# Patient Record
Sex: Female | Born: 1995 | Race: Black or African American | Hispanic: No | Marital: Single | Smoking: Former smoker
Health system: Southern US, Community
[De-identification: ages and names within clinical notes are randomized; demographics above are authoritative.]

---
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
## 2015-09-18 ENCOUNTER — Inpatient Hospital Stay (HOSPITAL_COMMUNITY): Payer: Medicaid Other

## 2015-09-18 ENCOUNTER — Inpatient Hospital Stay (HOSPITAL_COMMUNITY)
Admission: AD | Admit: 2015-09-18 | Discharge: 2015-09-18 | Disposition: A | Discharge status: Home / Self Care | Payer: Medicaid Other | Source: Ambulatory Visit | Attending: Family Medicine | Admitting: Family Medicine

## 2015-09-18 ENCOUNTER — Encounter (HOSPITAL_COMMUNITY): Payer: Self-pay

## 2015-09-18 DIAGNOSIS — O26899 Other specified pregnancy related conditions, unspecified trimester: Secondary | ICD-10-CM

## 2015-09-18 DIAGNOSIS — N912 Amenorrhea, unspecified: Secondary | ICD-10-CM | POA: Diagnosis not present

## 2015-09-18 DIAGNOSIS — R109 Unspecified abdominal pain: Secondary | ICD-10-CM | POA: Diagnosis present

## 2015-09-18 DIAGNOSIS — F17200 Nicotine dependence, unspecified, uncomplicated: Secondary | ICD-10-CM | POA: Insufficient documentation

## 2015-09-18 LAB — CBC WITH DIFFERENTIAL/PLATELET
Basophils Absolute: 0 10*3/uL (ref 0.0–0.1)
Basophils Relative: 0 %
EOS ABS: 0.2 10*3/uL (ref 0.0–0.7)
EOS PCT: 1 %
HCT: 44.9 % (ref 36.0–46.0)
Hemoglobin: 14.6 g/dL (ref 12.0–15.0)
LYMPHS ABS: 2.3 10*3/uL (ref 0.7–4.0)
Lymphocytes Relative: 17 %
MCH: 25.3 pg — AB (ref 26.0–34.0)
MCHC: 32.5 g/dL (ref 30.0–36.0)
MCV: 78 fL (ref 78.0–100.0)
MONOS PCT: 4 %
Monocytes Absolute: 0.5 10*3/uL (ref 0.1–1.0)
Neutro Abs: 10.3 10*3/uL — ABNORMAL HIGH (ref 1.7–7.7)
Neutrophils Relative %: 78 %
PLATELETS: 342 10*3/uL (ref 150–400)
RBC: 5.76 MIL/uL — ABNORMAL HIGH (ref 3.87–5.11)
RDW: 14.2 % (ref 11.5–15.5)
WBC: 13.3 10*3/uL — ABNORMAL HIGH (ref 4.0–10.5)

## 2015-09-18 LAB — URINALYSIS, ROUTINE W REFLEX MICROSCOPIC
Bilirubin Urine: NEGATIVE
Glucose, UA: NEGATIVE mg/dL
Hgb urine dipstick: NEGATIVE
Ketones, ur: NEGATIVE mg/dL
Leukocytes, UA: NEGATIVE
Nitrite: NEGATIVE
Protein, ur: NEGATIVE mg/dL
Specific Gravity, Urine: 1.02 (ref 1.005–1.030)
pH: 6.5 (ref 5.0–8.0)

## 2015-09-18 LAB — HCG, QUANTITATIVE, PREGNANCY: hCG, Beta Chain, Quant, S: <1 m[IU]/mL (ref <5)

## 2015-09-18 LAB — POCT PREGNANCY, URINE: Preg Test, Ur: NEGATIVE

## 2015-09-18 NOTE — MAU Note (Signed)
Pt had positive HPT. Stated she has been having abd pain and cramping off and on x 2 weeks.

## 2015-09-18 NOTE — MAU Provider Note (Signed)
History     CSN: [id number]  Arrival date and time: 09/18/15 [id number]   First Provider Initiated Contact with Patient 09/18/15 2206      Chief Complaint  Patient presents with  . Abdominal Pain   HPI Comments: Sharon Gould is a 20 y.o. Who presents today for a pregnancy test. She states that she had a +UPT at home. She reports LMP of approx 08/04/15. She denies any vaginal bleeding, but has had some intermittent abdominal pain.   Abdominal Pain This is a new problem. The current episode started 1 to 4 weeks ago. The problem occurs intermittently. The problem has been unchanged. The pain is located in the suprapubic region. The pain is at a severity of 0/10 (currently. Pain is only present occasionally in the mornings). Pertinent negatives include no constipation, diarrhea, dysuria, fever, nausea or vomiting. Nothing aggravates the pain. The pain is relieved by nothing. She has tried nothing for the symptoms.     History reviewed. No pertinent past medical history.  History reviewed. No pertinent past surgical history.  History reviewed. No pertinent family history.  Social History  Substance Use Topics  . Smoking status: Current Every Day Smoker  . Smokeless tobacco: None  . Alcohol Use: Yes    Allergies: No Known Allergies  Prescriptions prior to admission  Medication Sig Dispense Refill Last Dose  . ibuprofen (ADVIL,MOTRIN) 200 MG tablet Take 400 mg by mouth every 6 (six) hours as needed for moderate pain.   10/28/2013 at Unknown time  . Ibuprofen 200 MG CAPS Take 4 capsules (800 mg total) by mouth 3 (three) times daily. Take 600mg  3-4 times a day x 1 week. Then use as needed every 6 hours. 120 each 0     Review of Systems  Constitutional: Negative for fever and chills.  Gastrointestinal: Positive for abdominal pain. Negative for nausea, vomiting, diarrhea and constipation.  Genitourinary: Negative for dysuria and urgency.   Physical Exam   Blood pressure 141/82, pulse  73, temperature 99 F (37.2 C), temperature source Oral, resp. rate 18, height 5\' 7"  (1.702 m), weight 109.861 kg (242 lb 3.2 oz), last menstrual period 08/04/2015, unknown if currently breastfeeding.  Physical Exam  Nursing note and vitals reviewed. Constitutional: She is oriented to person, place, and time. She appears well-developed and well-nourished. No distress.  HENT:  Head: Normocephalic.  Cardiovascular: Normal rate.   Respiratory: Effort normal.  GI: Soft. There is no tenderness. There is no rebound.  Neurological: She is alert and oriented to person, place, and time.  Skin: Skin is warm and dry.  Psychiatric: She has a normal mood and affect.     Results for orders placed or performed during the hospital encounter of 09/18/15 (from the past 24 hour(s))  Urinalysis, Routine w reflex microscopic ([facility])     Status: None   Collection Time: 09/18/15  7:00 PM  Result Value Ref Range   Color, Urine YELLOW YELLOW   APPearance CLEAR CLEAR   Specific Gravity, Urine 1.020 1.005 - 1.030   pH 6.5 5.0 - 8.0   Glucose, UA NEGATIVE NEGATIVE mg/dL   Hgb urine dipstick NEGATIVE NEGATIVE   Bilirubin Urine NEGATIVE NEGATIVE   Ketones, ur NEGATIVE NEGATIVE mg/dL   Protein, ur NEGATIVE NEGATIVE mg/dL   Nitrite NEGATIVE NEGATIVE   Leukocytes, UA NEGATIVE NEGATIVE  Pregnancy, urine POC     Status: None   Collection Time: 09/18/15  7:11 PM  Result Value Ref Range   Preg  Test, Ur NEGATIVE NEGATIVE  CBC with Differential/Platelet     Status: Abnormal   Collection Time: 09/18/15  8:33 PM  Result Value Ref Range   WBC 13.3 (H) 4.0 - 10.5 K/uL   RBC 5.76 (H) 3.87 - 5.11 MIL/uL   Hemoglobin 14.6 12.0 - 15.0 g/dL   HCT 16.1 09.6 - 04.5 %   MCV 78.0 78.0 - 100.0 fL   MCH 25.3 (L) 26.0 - 34.0 pg   MCHC 32.5 30.0 - 36.0 g/dL   RDW 40.9 81.1 - 91.4 %   Platelets 342 150 - 400 K/uL   Neutrophils Relative % 78 %   Neutro Abs 10.3 (H) 1.7 - 7.7 K/uL   Lymphocytes Relative 17 %   Lymphs  Abs 2.3 0.7 - 4.0 K/uL   Monocytes Relative 4 %   Monocytes Absolute 0.5 0.1 - 1.0 K/uL   Eosinophils Relative 1 %   Eosinophils Absolute 0.2 0.0 - 0.7 K/uL   Basophils Relative 0 %   Basophils Absolute 0.0 0.0 - 0.1 K/uL  ABO/Rh     Status: None (Preliminary result)   Collection Time: 09/18/15  8:33 PM  Result Value Ref Range   ABO/RH(D) B POS   hCG, quantitative, pregnancy     Status: None   Collection Time: 09/18/15  8:33 PM  Result Value Ref Range   hCG, Beta Chain, Quant, S <1 <5 mIU/mL    MAU Course  Procedures  MDM   Assessment and Plan   1. Amenorrhea    DC home Comfort measures reviewed  Contraception options reviewed  RX: none  Return to MAU as needed   Follow-up Information    Schedule an appointment as soon as possible for a visit with [facility].   Contact information:   [address] [place] [address] [phone]         Tawnya Crook 09/18/2015, 10:09 PM

## 2015-09-18 NOTE — Discharge Instructions (Signed)
Contraception Choices Contraception (birth control) is the use of any methods or devices to prevent pregnancy. Below are some methods to help avoid pregnancy. HORMONAL METHODS   Contraceptive implant. This is a thin, plastic tube containing progesterone hormone. It does not contain estrogen hormone. Your health care provider inserts the tube in the inner part of the upper arm. The tube can remain in place for up to 3 years. After 3 years, the implant must be removed. The implant prevents the ovaries from releasing an egg (ovulation), thickens the cervical mucus to prevent sperm from entering the uterus, and thins the lining of the inside of the uterus.  Progesterone-only injections. These injections are given every 3 months by your health care provider to prevent pregnancy. This synthetic progesterone hormone stops the ovaries from releasing eggs. It also thickens cervical mucus and changes the uterine lining. This makes it harder for sperm to survive in the uterus.  Birth control pills. These pills contain estrogen and progesterone hormone. They work by preventing the ovaries from releasing eggs (ovulation). They also cause the cervical mucus to thicken, preventing the sperm from entering the uterus. Birth control pills are prescribed by a health care provider.Birth control pills can also be used to treat heavy periods.  Minipill. This type of birth control pill contains only the progesterone hormone. They are taken every day of each month and must be prescribed by your health care provider.  Birth control patch. The patch contains hormones similar to those in birth control pills. It must be changed once a week and is prescribed by a health care provider.  Vaginal ring. The ring contains hormones similar to those in birth control pills. It is left in the vagina for 3 weeks, removed for 1 week, and then a new one is put back in place. The patient must be comfortable inserting and removing the ring  from the vagina.A health care provider's prescription is necessary.  Emergency contraception. Emergency contraceptives prevent pregnancy after unprotected sexual intercourse. This pill can be taken right after sex or up to 5 days after unprotected sex. It is most effective the sooner you take the pills after having sexual intercourse. Most emergency contraceptive pills are available without a prescription. Check with your pharmacist. Do not use emergency contraception as your only form of birth control. BARRIER METHODS   Female condom. This is a thin sheath (latex or rubber) that is worn over the penis during sexual intercourse. It can be used with spermicide to increase effectiveness.  Female condom. This is a soft, loose-fitting sheath that is put into the vagina before sexual intercourse.  Diaphragm. This is a soft, latex, dome-shaped barrier that must be fitted by a health care provider. It is inserted into the vagina, along with a spermicidal jelly. It is inserted before intercourse. The diaphragm should be left in the vagina for 6 to 8 hours after intercourse.  Cervical cap. This is a round, soft, latex or plastic cup that fits over the cervix and must be fitted by a health care provider. The cap can be left in place for up to 48 hours after intercourse.  Sponge. This is a soft, circular piece of polyurethane foam. The sponge has spermicide in it. It is inserted into the vagina after wetting it and before sexual intercourse.  Spermicides. These are chemicals that kill or block sperm from entering the cervix and uterus. They come in the form of creams, jellies, suppositories, foam, or tablets. They do not require a   prescription. They are inserted into the vagina with an applicator before having sexual intercourse. The process must be repeated every time you have sexual intercourse. INTRAUTERINE CONTRACEPTION  Intrauterine device (IUD). This is a T-shaped device that is put in a woman's uterus  during a menstrual period to prevent pregnancy. There are 2 types:  Copper IUD. This type of IUD is wrapped in copper wire and is placed inside the uterus. Copper makes the uterus and fallopian tubes produce a fluid that kills sperm. It can stay in place for 10 years.  Hormone IUD. This type of IUD contains the hormone progestin (synthetic progesterone). The hormone thickens the cervical mucus and prevents sperm from entering the uterus, and it also thins the uterine lining to prevent implantation of a fertilized egg. The hormone can weaken or kill the sperm that get into the uterus. It can stay in place for 3-5 years, depending on which type of IUD is used. PERMANENT METHODS OF CONTRACEPTION  Female tubal ligation. This is when the woman's fallopian tubes are surgically sealed, tied, or blocked to prevent the egg from traveling to the uterus.  Hysteroscopic sterilization. This involves placing a small coil or insert into each fallopian tube. Your doctor uses a technique called hysteroscopy to do the procedure. The device causes scar tissue to form. This results in permanent blockage of the fallopian tubes, so the sperm cannot fertilize the egg. It takes about 3 months after the procedure for the tubes to become blocked. You must use another form of birth control for these 3 months.  Female sterilization. This is when the female has the tubes that carry sperm tied off (vasectomy).This blocks sperm from entering the vagina during sexual intercourse. After the procedure, the man can still ejaculate fluid (semen). NATURAL PLANNING METHODS  Natural family planning. This is not having sexual intercourse or using a barrier method (condom, diaphragm, cervical cap) on days the woman could become pregnant.  Calendar method. This is keeping track of the length of each menstrual cycle and identifying when you are fertile.  Ovulation method. This is avoiding sexual intercourse during ovulation.  Symptothermal  method. This is avoiding sexual intercourse during ovulation, using a thermometer and ovulation symptoms.  Post-ovulation method. This is timing sexual intercourse after you have ovulated. Regardless of which type or method of contraception you choose, it is important that you use condoms to protect against the transmission of sexually transmitted infections (STIs). Talk with your health care provider about which form of contraception is most appropriate for you.   This information is not intended to replace advice given to you by your health care provider. Make sure you discuss any questions you have with your health care provider.   Document Released: 08/30/2005 Document Revised: 09/04/2013 Document Reviewed: 02/22/2013 Elsevier Interactive Patient Education 2016 Elsevier Inc.  

## 2015-09-19 LAB — HIV ANTIBODY (ROUTINE TESTING W REFLEX): HIV Screen 4th Generation wRfx: NONREACTIVE

## 2015-09-19 LAB — RPR: RPR Ser Ql: NONREACTIVE

## 2015-09-19 LAB — ABO/RH: ABO/RH(D): B POS

## 2015-12-30 ENCOUNTER — Ambulatory Visit (HOSPITAL_COMMUNITY)
Admission: EM | Admit: 2015-12-30 | Discharge: 2015-12-30 | Disposition: A | Discharge status: Home / Self Care | Payer: Medicaid Other | Attending: Family Medicine | Admitting: Family Medicine

## 2015-12-30 ENCOUNTER — Encounter (HOSPITAL_COMMUNITY): Payer: Self-pay | Admitting: Emergency Medicine

## 2015-12-30 DIAGNOSIS — F17210 Nicotine dependence, cigarettes, uncomplicated: Secondary | ICD-10-CM | POA: Diagnosis not present

## 2015-12-30 DIAGNOSIS — Z711 Person with feared health complaint in whom no diagnosis is made: Secondary | ICD-10-CM

## 2015-12-30 DIAGNOSIS — Z79899 Other long term (current) drug therapy: Secondary | ICD-10-CM | POA: Insufficient documentation

## 2015-12-30 DIAGNOSIS — Z202 Contact with and (suspected) exposure to infections with a predominantly sexual mode of transmission: Secondary | ICD-10-CM | POA: Diagnosis present

## 2015-12-30 LAB — POCT URINALYSIS DIP (DEVICE)
BILIRUBIN URINE: NEGATIVE
GLUCOSE, UA: NEGATIVE mg/dL
KETONES UR: NEGATIVE mg/dL
Leukocytes, UA: NEGATIVE
NITRITE: NEGATIVE
PROTEIN: NEGATIVE mg/dL
Specific Gravity, Urine: 1.015 (ref 1.005–1.030)
Urobilinogen, UA: 0.2 mg/dL (ref 0.0–1.0)
pH: 7 (ref 5.0–8.0)

## 2015-12-30 NOTE — Discharge Instructions (Signed)
Safe Sex  Safe sex is about reducing the risk of giving or getting a sexually transmitted disease (STD). STDs are spread through sexual contact involving the genitals, mouth, or rectum. Some STDs can be cured and others cannot. Safe sex can also prevent unintended pregnancies.   WHAT ARE SOME SAFE SEX PRACTICES?  · Limit your sexual activity to only one partner who is having sex with only you.  · Talk to your partner about his or her past partners, past STDs, and drug use.  · Use a condom every time you have sexual intercourse. This includes vaginal, oral, and anal sexual activity. Both females and males should wear condoms during oral sex. Only use latex or polyurethane condoms and water-based lubricants. Using petroleum-based lubricants or oils to lubricate a condom will weaken the condom and increase the chance that it will break. The condom should be in place from the beginning to the end of sexual activity. Wearing a condom reduces, but does not completely eliminate, your risk of getting or giving an STD. STDs can be spread by contact with infected body fluids and skin.  · Get vaccinated for hepatitis B and HPV.  · Avoid alcohol and recreational drugs, which can affect your judgment. You may forget to use a condom or participate in high-risk sex.  · For females, avoid douching after sexual intercourse. Douching can spread an infection farther into the reproductive tract.  · Check your body for signs of sores, blisters, rashes, or unusual discharge. See your health care provider if you notice any of these signs.  · Avoid sexual contact if you have symptoms of an infection or are being treated for an STD. If you or your partner has herpes, avoid sexual contact when blisters are present. Use condoms at all other times.  · If you are at risk of being infected with HIV, it is recommended that you take a prescription medicine daily to prevent HIV infection. This is called pre-exposure prophylaxis (PrEP). You are  considered at risk if:    You are a man who has sex with other men (MSM).    You are a heterosexual man or woman who is sexually active with more than one partner.    You take drugs by injection.    You are sexually active with a partner who has HIV.  · Talk with your health care provider about whether you are at high risk of being infected with HIV. If you choose to begin PrEP, you should first be tested for HIV. You should then be tested every 3 months for as long as you are taking PrEP.  · See your health care provider for regular screenings, exams, and tests for other STDs. Before having sex with a new partner, each of you should be screened for STDs and should talk about the results with each other.  WHAT ARE THE BENEFITS OF SAFE SEX?   · There is less chance of getting or giving an STD.  · You can prevent unwanted or unintended pregnancies.  · By discussing safe sex concerns with your partner, you may increase feelings of intimacy, comfort, trust, and honesty between the two of you.     This information is not intended to replace advice given to you by your health care provider. Make sure you discuss any questions you have with your health care provider.     Document Released: 10/07/2004 Document Revised: 09/20/2014 Document Reviewed: 02/21/2012  Elsevier Interactive Patient Education ©2016 Elsevier Inc.

## 2015-12-30 NOTE — ED Notes (Signed)
PT discharged by Barbra SarksFrank Patrick, PA. Pt ambulates out of department with her visitor.

## 2015-12-30 NOTE — ED Provider Notes (Signed)
CSN: [id number]     Arrival date & time 12/30/15  1841 History   First MD Initiated Contact with Patient 12/30/15 2012     Chief Complaint  Patient presents with  . SEXUALLY TRANSMITTED DISEASE    PT wants an STD check   (Consider location/radiation/quality/duration/timing/severity/associated sxs/prior Treatment) HPI STD eval no symptoms. Has not been checked in a while.  History reviewed. No pertinent past medical history. History reviewed. No pertinent past surgical history. No family history on file. Social History  Substance Use Topics  . Smoking status: Current Every Day Smoker -- 0.25 packs/day  . Smokeless tobacco: None  . Alcohol Use: Yes     Comment: occasionally   OB History    Gravida Para Term Preterm AB TAB SAB Ectopic Multiple Living   0              Review of Systems Here for STD check no symptoms Allergies  Review of patient's allergies indicates no known allergies.  Home Medications   Prior to Admission medications   Medication Sig Start Date End Date Taking? Authorizing Provider  ibuprofen (ADVIL,MOTRIN) 200 MG tablet Take 400 mg by mouth every 6 (six) hours as needed for moderate pain.    Historical Provider, MD  Ibuprofen 200 MG CAPS Take 4 capsules (800 mg total) by mouth 3 (three) times daily. Take 600mg  3-4 times a day x 1 week. Then use as needed every 6 hours. 10/29/13   Joelyn OmsJalan Burton, MD   Meds Ordered and Administered this Visit  Medications - No data to display  BP 146/82 mmHg  Pulse 88  Temp(Src) 98.7 F (37.1 C)  Resp 16  SpO2 99%  LMP 12/29/2015 No data found.   Physical Exam NURSES NOTES AND VITAL SIGNS REVIEWED. CONSTITUTIONAL: Well developed, well nourished, no acute distress HEENT: normocephalic, atraumatic EYES: Conjunctiva normal NECK:normal ROM, supple, no adenopathy PULMONARY:No respiratory distress, normal effort MUSCULOSKELETAL: Normal ROM of all extremities,  SKIN: warm and dry without rash PSYCHIATRIC: Mood and  affect, behavior are normal  ED Course  Procedures (including critical care time)  Labs Review Labs Reviewed  POCT URINALYSIS DIP (DEVICE) - Abnormal; Notable for the following:    Hgb urine dipstick LARGE (*)    All other components within normal limits  URINE CYTOLOGY ANCILLARY ONLY    Imaging Review No results found.   Visual Acuity Review  Right Eye Distance:   Left Eye Distance:   Bilateral Distance:    Right Eye Near:   Left Eye Near:    Bilateral Near:         MDM   1. Concern about STD in female without diagnosis     Patient is reassured that there are no issues that require transfer to higher level of care at this time or additional tests. Patient is advised to continue home symptomatic treatment. Patient is advised that if there are new or worsening symptoms to attend the emergency department, contact primary care provider, or return to UC. Instructions of care provided discharged home in stable condition.    THIS NOTE WAS GENERATED USING A VOICE RECOGNITION SOFTWARE PROGRAM. ALL REASONABLE EFFORTS  WERE MADE TO PROOFREAD THIS DOCUMENT FOR ACCURACY.  I have verbally reviewed the discharge instructions with the patient. A printed AVS was given to the patient.  All questions were answered prior to discharge.      Tharon AquasFrank C Joaquim Tolen, PA 12/30/15 2031

## 2015-12-30 NOTE — ED Notes (Signed)
PT requests an STD check. PT is unaware of exposure, but she does have unprotected sex. PT reports slight abdominal pain. PT is on her menstrual cycle. PT had chlymydia two months ago and was treated, but she never followed up for recheck.

## 2015-12-31 LAB — URINE CYTOLOGY ANCILLARY ONLY
Chlamydia: NEGATIVE
Neisseria Gonorrhea: NEGATIVE

## 2016-03-05 ENCOUNTER — Encounter (HOSPITAL_COMMUNITY): Payer: Self-pay | Admitting: Emergency Medicine

## 2016-03-05 ENCOUNTER — Emergency Department (HOSPITAL_COMMUNITY)
Admission: EM | Admit: 2016-03-05 | Discharge: 2016-03-05 | Disposition: A | Discharge status: Home / Self Care | Payer: Medicaid Other | Attending: Emergency Medicine | Admitting: Emergency Medicine

## 2016-03-05 DIAGNOSIS — R109 Unspecified abdominal pain: Secondary | ICD-10-CM | POA: Insufficient documentation

## 2016-03-05 DIAGNOSIS — R112 Nausea with vomiting, unspecified: Secondary | ICD-10-CM | POA: Diagnosis not present

## 2016-03-05 DIAGNOSIS — R197 Diarrhea, unspecified: Secondary | ICD-10-CM | POA: Diagnosis not present

## 2016-03-05 DIAGNOSIS — Z791 Long term (current) use of non-steroidal anti-inflammatories (NSAID): Secondary | ICD-10-CM | POA: Diagnosis not present

## 2016-03-05 DIAGNOSIS — Z79899 Other long term (current) drug therapy: Secondary | ICD-10-CM | POA: Diagnosis not present

## 2016-03-05 DIAGNOSIS — F17200 Nicotine dependence, unspecified, uncomplicated: Secondary | ICD-10-CM | POA: Diagnosis not present

## 2016-03-05 LAB — COMPREHENSIVE METABOLIC PANEL
ALBUMIN: 4.3 g/dL (ref 3.5–5.0)
ALT: 31 U/L (ref 14–54)
ANION GAP: 6 (ref 5–15)
AST: 23 U/L (ref 15–41)
Alkaline Phosphatase: 56 U/L (ref 38–126)
BUN: 11 mg/dL (ref 6–20)
CHLORIDE: 107 mmol/L (ref 101–111)
CO2: 28 mmol/L (ref 22–32)
Calcium: 9.3 mg/dL (ref 8.9–10.3)
Creatinine, Ser: 0.88 mg/dL (ref 0.44–1.00)
GFR calc Af Amer: >60 mL/min (ref >60)
GFR calc non Af Amer: >60 mL/min (ref >60)
GLUCOSE: 102 mg/dL — AB (ref 65–99)
POTASSIUM: 4.2 mmol/L (ref 3.5–5.1)
Sodium: 141 mmol/L (ref 135–145)
TOTAL PROTEIN: 7.5 g/dL (ref 6.5–8.1)
Total Bilirubin: 0.8 mg/dL (ref 0.3–1.2)

## 2016-03-05 LAB — CBC
HCT: 43.7 % (ref 36.0–46.0)
Hemoglobin: 14.8 g/dL (ref 12.0–15.0)
MCH: 25.5 pg — ABNORMAL LOW (ref 26.0–34.0)
MCHC: 33.9 g/dL (ref 30.0–36.0)
MCV: 75.3 fL — ABNORMAL LOW (ref 78.0–100.0)
Platelets: 324 K/uL (ref 150–400)
RBC: 5.8 MIL/uL — ABNORMAL HIGH (ref 3.87–5.11)
RDW: 13.9 % (ref 11.5–15.5)
WBC: 11.7 K/uL — ABNORMAL HIGH (ref 4.0–10.5)

## 2016-03-05 LAB — POC OCCULT BLOOD, ED: FECAL OCCULT BLD: NEGATIVE

## 2016-03-05 LAB — URINALYSIS, ROUTINE W REFLEX MICROSCOPIC
Bilirubin Urine: NEGATIVE
Glucose, UA: NEGATIVE mg/dL
Hgb urine dipstick: NEGATIVE
Ketones, ur: NEGATIVE mg/dL
LEUKOCYTES UA: NEGATIVE
NITRITE: NEGATIVE
Protein, ur: NEGATIVE mg/dL
SPECIFIC GRAVITY, URINE: 1.028 (ref 1.005–1.030)
pH: 7.5 (ref 5.0–8.0)

## 2016-03-05 LAB — LIPASE, BLOOD: Lipase: 28 U/L (ref 11–51)

## 2016-03-05 LAB — I-STAT BETA HCG BLOOD, ED (MC, WL, AP ONLY)

## 2016-03-05 MED ORDER — DICYCLOMINE HCL 20 MG PO TABS: 20.0000 mg | ORAL_TABLET | Freq: Two times a day (BID) | ORAL | Status: DC

## 2016-03-05 MED ORDER — ONDANSETRON 8 MG PO TBDP: 8.0000 mg | ORAL_TABLET | Freq: Three times a day (TID) | ORAL | Status: DC | PRN

## 2016-03-05 MED ORDER — ONDANSETRON 8 MG PO TBDP
8.0000 mg | ORAL_TABLET | Freq: Once | ORAL | Status: AC
  Administered 2016-03-05: 8 mg via ORAL
  Filled 2016-03-05: qty 1

## 2016-03-05 MED ORDER — DICYCLOMINE HCL 10 MG/ML IM SOLN
20.0000 mg | Freq: Once | INTRAMUSCULAR | Status: AC
  Administered 2016-03-05: 20 mg via INTRAMUSCULAR
  Filled 2016-03-05: qty 2

## 2016-03-05 NOTE — ED Notes (Signed)
Pt states she's been having abd cramping with vomiting and diarrhea x 2 days. Epigastric pain. Pt c/o urinary hesitancy. States last menstrual period was two weeks prior.

## 2016-03-05 NOTE — ED Provider Notes (Signed)
CSN: [id number]     Arrival date & time 03/05/16  [id number] History   First MD Initiated Contact with Patient 03/05/16 1147     Chief Complaint  Patient presents with  . Abdominal Pain  . Emesis  . Diarrhea     (Consider location/radiation/quality/duration/timing/severity/associated sxs/prior Treatment) HPI [facility] Gould is a 20 y.o. female with no medical problemPresents to emergency department complaining of nausea, vomiting, diarrhea, abdominal cramping. Patient states symptoms have been there for 2 days. She states "I think food poisoning." She states she has had 7 episodes of emesis yesterday. She states symptoms began with abdominal cramping, shortly after that she has to have a bowel movement and sometimes has episode of emesis. She states her stools are not watery, there actually "small pieces of soft stool." She denies any blood in her stool, she states her stool is black, and she states she has had some streaks of blood in her emesis. She states she has been taking Pepto-Bismol which has not helped. She denies any fever or chills. She states abdominal pain comes and goes. No other complaint.  History reviewed. No pertinent past medical history. History reviewed. No pertinent past surgical history. History reviewed. No pertinent family history. Social History  Substance Use Topics  . Smoking status: Current Every Day Smoker -- 0.25 packs/day  . Smokeless tobacco: None  . Alcohol Use: Yes     Comment: occasionally   OB History    Gravida Para Term Preterm AB TAB SAB Ectopic Multiple Living   0              Review of Systems  Constitutional: Negative for fever and chills.  Respiratory: Negative for cough, chest tightness and shortness of breath.   Cardiovascular: Negative for chest pain, palpitations and leg swelling.  Gastrointestinal: Positive for nausea, vomiting, abdominal pain and diarrhea.  Genitourinary: Negative for dysuria, flank pain, vaginal bleeding, vaginal discharge,  vaginal pain and pelvic pain.  Musculoskeletal: Negative for myalgias, arthralgias, neck pain and neck stiffness.  Skin: Negative for rash.  Neurological: Negative for dizziness, weakness and headaches.  All other systems reviewed and are negative.     Allergies  Review of patient's allergies indicates no known allergies.  Home Medications   Prior to Admission medications   Medication Sig Start Date End Date Taking? Authorizing Provider  bismuth subsalicylate (PEPTO BISMOL) 262 MG/15ML suspension Take 30 mLs by mouth every 6 (six) hours as needed for indigestion.   Yes Historical Provider, MD  ibuprofen (ADVIL,MOTRIN) 200 MG tablet Take 400-600 mg by mouth every 6 (six) hours as needed for moderate pain.    Yes Historical Provider, MD   BP 130/86 mmHg  Pulse 73  Temp(Src) 99 F (37.2 C) (Oral)  Resp 16  Ht 5\' 7"  (1.702 m)  Wt 89.359 kg  BMI 30.85 kg/m2  SpO2 98% Physical Exam  Constitutional: She is oriented to person, place, and time. She appears well-developed and well-nourished. No distress.  HENT:  Head: Normocephalic.  Eyes: Conjunctivae are normal.  Neck: Neck supple.  Cardiovascular: Normal rate, regular rhythm and normal heart sounds.   Pulmonary/Chest: Effort normal and breath sounds normal. No respiratory distress. She has no wheezes. She has no rales.  Abdominal: Soft. Bowel sounds are normal. She exhibits no distension. There is tenderness. There is no rebound and no guarding.  Left upper quadrant tenderness  Musculoskeletal: She exhibits no edema.  Neurological: She is alert and oriented to person, place, and time.  Skin:  Skin is warm and dry.  Psychiatric: She has a normal mood and affect. Her behavior is normal.  Nursing note and vitals reviewed.   ED Course  Procedures (including critical care time) Labs Review Labs Reviewed  COMPREHENSIVE METABOLIC PANEL - Abnormal; Notable for the following:    Glucose, Bld 102 (*)    All other components within  normal limits  CBC - Abnormal; Notable for the following:    WBC 11.7 (*)    RBC 5.80 (*)    MCV 75.3 (*)    MCH 25.5 (*)    All other components within normal limits  LIPASE, BLOOD  URINALYSIS, ROUTINE W REFLEX MICROSCOPIC (NOT AT [facility])  I-STAT BETA HCG BLOOD, ED ([facility], [facility], [facility] ONLY)  POC OCCULT BLOOD, ED    Imaging Review No results found. I have personally reviewed and evaluated these images and lab results as part of my medical decision-making.   EKG Interpretation None      MDM   Final diagnoses:  Nausea vomiting and diarrhea   Patient with nausea vomiting diarrhea, abdominal pain for 2 days. She is nontoxic appearing. Vital signs are within normal. No fever. Her abdomen is benign. It is tender left upper quadrant, no guarding or rebound tenderness present. Will check labs, we'll try some Bentyl and Zofran.  Negative pregnancy test. Normal electrolytes and renal function. White blood cell count mildly elevated at 11.7. Urinalysis is normal as well. Rectal exam showed brown stool, Hemoccult negative. She is otherwise healthy, not vomiting in emergency department. Tolerating oral fluids. Suspect possible viral gastroenteritis, will discharge home with symptomatic treatment. I did discuss with patient the need for return if her symptoms are worsening or if she develops new concerning symptoms. She was understanding.  Filed Vitals:   03/05/16 0954 03/05/16 1410  BP: 130/86 120/84  Pulse: 73 61  Temp: 99 F (37.2 C) 98.8 F (37.1 C)  TempSrc: Oral Oral  Resp: 16 14  Height: 5\' 7"  (1.702 m)   Weight: 89.359 kg   SpO2: 98% 100%        Jaynie Crumbleatyana Zechariah Bissonnette, PA-C 03/05/16 1539  Jaynie Crumbleatyana Virgel Haro, PA-C 03/05/16 1539  Rolland PorterMark James, MD 03/15/16 [phone]

## 2016-03-05 NOTE — Discharge Instructions (Signed)
Bentyl for abdominal cramping. zofran as needed for nausea. Follow up with primary care doctor. Return if worsening.   Nausea and Vomiting Nausea is a sick feeling that often comes before throwing up (vomiting). Vomiting is a reflex where stomach contents come out of your mouth. Vomiting can cause severe loss of body fluids (dehydration). Children and elderly adults can become dehydrated quickly, especially if they also have diarrhea. Nausea and vomiting are symptoms of a condition or disease. It is important to find the cause of your symptoms. CAUSES   Direct irritation of the stomach lining. This irritation can result from increased acid production (gastroesophageal reflux disease), infection, food poisoning, taking certain medicines (such as nonsteroidal anti-inflammatory drugs), alcohol use, or tobacco use.  Signals from the brain.These signals could be caused by a headache, heat exposure, an inner ear disturbance, increased pressure in the brain from injury, infection, a tumor, or a concussion, pain, emotional stimulus, or metabolic problems.  An obstruction in the gastrointestinal tract (bowel obstruction).  Illnesses such as diabetes, hepatitis, gallbladder problems, appendicitis, kidney problems, cancer, sepsis, atypical symptoms of a heart attack, or eating disorders.  Medical treatments such as chemotherapy and radiation.  Receiving medicine that makes you sleep (general anesthetic) during surgery. DIAGNOSIS Your caregiver may ask for tests to be done if the problems do not improve after a few days. Tests may also be done if symptoms are severe or if the reason for the nausea and vomiting is not clear. Tests may include:  Urine tests.  Blood tests.  Stool tests.  Cultures (to look for evidence of infection).  X-rays or other imaging studies. Test results can help your caregiver make decisions about treatment or the need for additional tests. TREATMENT You need to stay well  hydrated. Drink frequently but in small amounts.You may wish to drink water, sports drinks, clear broth, or eat frozen ice pops or gelatin dessert to help stay hydrated.When you eat, eating slowly may help prevent nausea.There are also some antinausea medicines that may help prevent nausea. HOME CARE INSTRUCTIONS   Take all medicine as directed by your caregiver.  If you do not have an appetite, do not force yourself to eat. However, you must continue to drink fluids.  If you have an appetite, eat a normal diet unless your caregiver tells you differently.  Eat a variety of complex carbohydrates (rice, wheat, potatoes, bread), lean meats, yogurt, fruits, and vegetables.  Avoid high-fat foods because they are more difficult to digest.  Drink enough water and fluids to keep your urine clear or pale yellow.  If you are dehydrated, ask your caregiver for specific rehydration instructions. Signs of dehydration may include:  Severe thirst.  Dry lips and mouth.  Dizziness.  Dark urine.  Decreasing urine frequency and amount.  Confusion.  Rapid breathing or pulse. SEEK IMMEDIATE MEDICAL CARE IF:   You have blood or brown flecks (like coffee grounds) in your vomit.  You have black or bloody stools.  You have a severe headache or stiff neck.  You are confused.  You have severe abdominal pain.  You have chest pain or trouble breathing.  You do not urinate at least once every 8 hours.  You develop cold or clammy skin.  You continue to vomit for longer than 24 to 48 hours.  You have a fever. MAKE SURE YOU:   Understand these instructions.  Will watch your condition.  Will get help right away if you are not doing well or get worse.  This information is not intended to replace advice given to you by your health care provider. Make sure you discuss any questions you have with your health care provider.   Document Released: 08/30/2005 Document Revised: 11/22/2011  Document Reviewed: 01/27/2011 Elsevier Interactive Patient Education [employer]

## 2016-03-05 NOTE — ED Notes (Signed)
Patient ambulated to restroom, did not give urine specimen

## 2016-07-28 ENCOUNTER — Emergency Department (HOSPITAL_COMMUNITY)
Admission: EM | Admit: 2016-07-28 | Discharge: 2016-07-28 | Disposition: A | Discharge status: Home / Self Care | Payer: Medicaid Other | Attending: Emergency Medicine | Admitting: Emergency Medicine

## 2016-07-28 ENCOUNTER — Encounter (HOSPITAL_COMMUNITY): Payer: Self-pay

## 2016-07-28 DIAGNOSIS — F17200 Nicotine dependence, unspecified, uncomplicated: Secondary | ICD-10-CM | POA: Diagnosis not present

## 2016-07-28 DIAGNOSIS — R45851 Suicidal ideations: Secondary | ICD-10-CM | POA: Diagnosis present

## 2016-07-28 DIAGNOSIS — F331 Major depressive disorder, recurrent, moderate: Secondary | ICD-10-CM | POA: Diagnosis not present

## 2016-07-28 DIAGNOSIS — Z79899 Other long term (current) drug therapy: Secondary | ICD-10-CM | POA: Insufficient documentation

## 2016-07-28 LAB — CBC WITH DIFFERENTIAL/PLATELET
Basophils Absolute: 0 10*3/uL (ref 0.0–0.1)
Basophils Relative: 0 %
EOS PCT: 3 %
Eosinophils Absolute: 0.3 10*3/uL (ref 0.0–0.7)
HCT: 45.2 % (ref 36.0–46.0)
Hemoglobin: 14.8 g/dL (ref 12.0–15.0)
LYMPHS ABS: 2 10*3/uL (ref 0.7–4.0)
Lymphocytes Relative: 16 %
MCH: 25 pg — AB (ref 26.0–34.0)
MCHC: 32.7 g/dL (ref 30.0–36.0)
MCV: 76.5 fL — AB (ref 78.0–100.0)
MONO ABS: 0.6 10*3/uL (ref 0.1–1.0)
Monocytes Relative: 5 %
Neutro Abs: 9.7 10*3/uL — ABNORMAL HIGH (ref 1.7–7.7)
Neutrophils Relative %: 76 %
PLATELETS: 299 10*3/uL (ref 150–400)
RBC: 5.91 MIL/uL — AB (ref 3.87–5.11)
RDW: 13.6 % (ref 11.5–15.5)
WBC: 12.7 10*3/uL — AB (ref 4.0–10.5)

## 2016-07-28 LAB — RAPID URINE DRUG SCREEN, HOSP PERFORMED
AMPHETAMINES: NOT DETECTED
BARBITURATES: NOT DETECTED
Benzodiazepines: POSITIVE — AB
Cocaine: NOT DETECTED
Opiates: NOT DETECTED
Tetrahydrocannabinol: POSITIVE — AB

## 2016-07-28 LAB — COMPREHENSIVE METABOLIC PANEL
ALT: 24 U/L (ref 14–54)
AST: 20 U/L (ref 15–41)
Albumin: 4.2 g/dL (ref 3.5–5.0)
Alkaline Phosphatase: 59 U/L (ref 38–126)
Anion gap: 8 (ref 5–15)
BUN: 7 mg/dL (ref 6–20)
CHLORIDE: 108 mmol/L (ref 101–111)
CO2: 25 mmol/L (ref 22–32)
CREATININE: 0.89 mg/dL (ref 0.44–1.00)
Calcium: 9.3 mg/dL (ref 8.9–10.3)
GFR calc Af Amer: >60 mL/min (ref >60)
GFR calc non Af Amer: >60 mL/min (ref >60)
Glucose, Bld: 92 mg/dL (ref 65–99)
Potassium: 3.5 mmol/L (ref 3.5–5.1)
SODIUM: 141 mmol/L (ref 135–145)
Total Bilirubin: 0.5 mg/dL (ref 0.3–1.2)
Total Protein: 7 g/dL (ref 6.5–8.1)

## 2016-07-28 LAB — URINALYSIS, ROUTINE W REFLEX MICROSCOPIC
BILIRUBIN URINE: NEGATIVE
Glucose, UA: NEGATIVE mg/dL
HGB URINE DIPSTICK: NEGATIVE
KETONES UR: NEGATIVE mg/dL
Leukocytes, UA: NEGATIVE
Nitrite: NEGATIVE
PROTEIN: NEGATIVE mg/dL
Specific Gravity, Urine: 1.026 (ref 1.005–1.030)
pH: 7 (ref 5.0–8.0)

## 2016-07-28 LAB — ETHANOL: Alcohol, Ethyl (B): <5 mg/dL (ref <5)

## 2016-07-28 LAB — POC URINE PREG, ED: PREG TEST UR: NEGATIVE

## 2016-07-28 MED ORDER — NICOTINE 21 MG/24HR TD PT24: 21.0000 mg | MEDICATED_PATCH | Freq: Every day | TRANSDERMAL | Status: DC

## 2016-07-28 MED ORDER — IBUPROFEN 200 MG PO TABS: 600.0000 mg | ORAL_TABLET | Freq: Three times a day (TID) | ORAL | Status: DC | PRN

## 2016-07-28 MED ORDER — LORAZEPAM 1 MG PO TABS: 1.0000 mg | ORAL_TABLET | Freq: Three times a day (TID) | ORAL | Status: DC | PRN

## 2016-07-28 MED ORDER — ACETAMINOPHEN 325 MG PO TABS: 650.0000 mg | ORAL_TABLET | ORAL | Status: DC | PRN

## 2016-07-28 MED ORDER — ALUM & MAG HYDROXIDE-SIMETH 200-200-20 MG/5ML PO SUSP: 30.0000 mL | ORAL | Status: DC | PRN

## 2016-07-28 MED ORDER — ONDANSETRON HCL 4 MG PO TABS: 4.0000 mg | ORAL_TABLET | Freq: Three times a day (TID) | ORAL | Status: DC | PRN

## 2016-07-28 MED ORDER — ZOLPIDEM TARTRATE 5 MG PO TABS: 5.0000 mg | ORAL_TABLET | Freq: Every evening | ORAL | Status: DC | PRN

## 2016-07-28 NOTE — ED Notes (Signed)
Patient A/O, no noted distress. Patient noted she just recently moved out of her parents home willingly to move in with boyfriend. She never experienced having suicidal thoughts, recently she have been alone more than usual and she is in between jobs. Patient plans on starting a job training in December. She informed her boyfriend of thoughts and he advised her to go get some help and to call her mom. She notes "this is my first time moving out and I am worried, but I seem to be alright now since I am around people." Writer ask, "are you afraid of being alone?" Patient noted "no, yes, yeah." Writer informed patient that whenever she has thoughts of harming self notify the police, fire department, and hospital. Writer ask patient to contract to safety and educated her on it. Patient verbally agreed to notify staff, if there are any thoughts of harming self. Patient may have some guilt of coming here. Patient has a great support team. Patient first time in behavior health.  Sister is visiting patient. Staff will continue to monitor, maintain safety, and meet needs.

## 2016-07-28 NOTE — Discharge Instructions (Signed)
Read the information below.  At this time you are being discharged and provided outpatient resources to help with your depression.  Please call [facility] in the morning to schedule a follow up appointment and for further management of your depression.  Please call your primary doctor in the morning to schedule a follow up appointment from the ED.   You may return to the Emergency Department at any time for worsening condition or any new symptoms that concern you. Return to ED if you develop thoughts of hurting yourself or others, visual or auditory hallucinations, or any other new/concerning symptoms.

## 2016-07-28 NOTE — ED Notes (Signed)
Patient belongings placed (1 bag) in locker 30.

## 2016-07-28 NOTE — ED Notes (Signed)
Pt made aware of the need for UA. 

## 2016-07-28 NOTE — ED Notes (Signed)
Patient A/O, no noted distress. Denies SI/HI/AVH/Pain. Patient was educated on resources and discharge instructions: follow up appointments and Suicidal numbers located on page 3 of her documents. Patient have all belongings and has signed for them. Verbally contracted to safety. TCU educated patient on resources, as well. Staff will continue to monitor and meet needs. Patient noted mother will be picking her up

## 2016-07-28 NOTE — ED Triage Notes (Signed)
She tells me that she "was all alone-and when I get all alone I start having crazy thoughts, and my voice is loud in my head saying I don't want to be here anymore". She denies any plan for suicide--just having thoughts.

## 2016-07-28 NOTE — ED Provider Notes (Signed)
[facility] DEPT Provider Note   CSN: [id number] Arrival date & time: 07/28/16  [place]     History   Chief Complaint Chief Complaint  Patient presents with  . Suicidal    HPI Sharon Gould is a 20 y.o. female.  [facility] Gould is a 20 y.o. female presents to ED with suicidal thoughts. Patient states for the last couple of weeks she has felt increasingly sad and depressed. She endorses suicidal thoughts, although she does not have a plan. She states she has dreams of herself dying. She reports hearing her voiced in her head telling her she is "stupid" and "nothing." She also states "I just want to go to sleep and not wake up." She denies any recent triggers. No homicidal thoughts. No visual hallucinations. She endorses smoking 1 pack every 2 days, drinks alcohol on occasion, and smokes marijuana. She denies any medical conditions. She has never been treated or hospitalized for depression. She denies any family h/o depression.       History reviewed. No pertinent past medical history.  There are no active problems to display for this patient.   No past surgical history on file.  OB History    Gravida Para Term Preterm AB Living   0             SAB TAB Ectopic Multiple Live Births                   Home Medications    Prior to Admission medications   Medication Sig Start Date End Date Taking? Authorizing Provider  Acetaminophen (MIDOL PO) Take by mouth every 6 (six) hours as needed (menstrual cramps.).   Yes Historical Provider, MD  dicyclomine (BENTYL) 20 MG tablet Take 1 tablet (20 mg total) by mouth 2 (two) times daily. Patient not taking: Reported on 07/28/2016 03/05/16   Tatyana Kirichenko, PA-C  ondansetron (ZOFRAN ODT) 8 MG disintegrating tablet Take 1 tablet (8 mg total) by mouth every 8 (eight) hours as needed for nausea or vomiting. Patient not taking: Reported on 07/28/2016 03/05/16   Jaynie Crumble, PA-C    Family History No family history on  file.  Social History Social History  Substance Use Topics  . Smoking status: Current Every Day Smoker    Packs/day: 0.25  . Smokeless tobacco: Not on file  . Alcohol use Yes     Comment: occasionally     Allergies   Patient has no known allergies.   Review of Systems Review of Systems  Constitutional: Negative for fever.  HENT: Negative for trouble swallowing.   Eyes: Negative for visual disturbance.  Respiratory: Negative for shortness of breath.   Cardiovascular: Negative for chest pain.  Gastrointestinal: Negative for nausea and vomiting.  Genitourinary: Negative for dysuria and hematuria.  Musculoskeletal: Negative for arthralgias.  Skin: Negative for rash.  Neurological: Negative for syncope.  Psychiatric/Behavioral: Positive for suicidal ideas. The patient is not nervous/anxious.      Physical Exam Updated Vital Signs BP 139/88 (BP Location: Left Arm)   Pulse 71   Temp 97.8 F (36.6 C) (Oral)   Resp 16   LMP 07/06/2016 (Approximate)   SpO2 95%   Physical Exam  Constitutional: She appears well-developed and well-nourished.  HENT:  Head: Normocephalic and atraumatic.  Eyes: Conjunctivae and EOM are normal. Right eye exhibits no discharge. Left eye exhibits no discharge. No scleral icterus.  Neck: Normal range of motion and phonation normal. Neck supple. No neck rigidity. Normal range of  motion present.  Cardiovascular: Normal rate, regular rhythm, normal heart sounds and intact distal pulses.   No murmur heard. Pulmonary/Chest: Effort normal and breath sounds normal. No stridor. No respiratory distress. She has no wheezes. She has no rales.  Abdominal: Soft. Bowel sounds are normal. She exhibits no distension. There is no tenderness. There is no rigidity, no rebound, no guarding and no CVA tenderness.  Musculoskeletal: Normal range of motion.  Neurological: She is alert. She is not disoriented. Coordination and gait normal. GCS eye subscore is 4. GCS verbal  subscore is 5. GCS motor subscore is 6.  Skin: Skin is warm and dry. She is not diaphoretic.  Psychiatric: She is withdrawn. She exhibits a depressed mood.  Patient is tearful.      ED Treatments / Results  Labs (all labs ordered are listed, but only abnormal results are displayed) Labs Reviewed  CBC WITH DIFFERENTIAL/PLATELET - Abnormal; Notable for the following:       Result Value   WBC 12.7 (*)    RBC 5.91 (*)    MCV 76.5 (*)    MCH 25.0 (*)    Neutro Abs 9.7 (*)    All other components within normal limits  RAPID URINE DRUG SCREEN, HOSP PERFORMED - Abnormal; Notable for the following:    Benzodiazepines POSITIVE (*)    Tetrahydrocannabinol POSITIVE (*)    All other components within normal limits  COMPREHENSIVE METABOLIC PANEL  ETHANOL  URINALYSIS, ROUTINE W REFLEX MICROSCOPIC (NOT AT [facility])  POC URINE PREG, ED    EKG  EKG Interpretation None       Radiology No results found.  Procedures Procedures (including critical care time)  Medications Ordered in ED Medications  alum & mag hydroxide-simeth (MAALOX/MYLANTA) 200-200-20 MG/5ML suspension 30 mL (not administered)  ondansetron (ZOFRAN) tablet 4 mg (not administered)  nicotine (NICODERM CQ - dosed in mg/24 hours) patch 21 mg (not administered)  zolpidem (AMBIEN) tablet 5 mg (not administered)  ibuprofen (ADVIL,MOTRIN) tablet 600 mg (not administered)  acetaminophen (TYLENOL) tablet 650 mg (not administered)  LORazepam (ATIVAN) tablet 1 mg (not administered)     Initial Impression / Assessment and Plan / ED Course  I have reviewed the triage vital signs and the nursing notes.  Pertinent labs & imaging results that were available during my care of the patient were reviewed by me and considered in my medical decision making (see chart for details).  Clinical Course     Patient presents to ED with suicidal ideation. Patient is afebrile and non-toxic appearing in NAD. VSS.  She is tearful and withdrawn.  Lungs are CTABL. Heart RRR. Abdomen +BS, soft, non-distended, non-tender. Will check basic labs.   Upreg negative. CMP re-assuring. Mild Leukocytosis. No SOB, lungs are CTABL, no hypoxia - doubt PNA. UDS +BZD and THC. No evidence of UTI. Pt has had elevated WBC in past. Pt is medically cleared. TTS consult pending.   10:29 PM: Sp[facility] with Beatriz StallionMarcus Harvey [facility], who spoke with behavioral health PA. Patient does not meet inpatient criteria at this time. Pt to sign safety contract and to follow up with [facility]. On re-evaluation patient is smiling and interactive. She feels better and comfortable with going home. Patient has support system in place. Discussed strict return precautions. Pt voiced understanding and is agreeable. Pt safe for d/c at this time.      Final Clinical Impressions(s) / ED Diagnoses   Final diagnoses:  Suicidal thoughts    New Prescriptions New Prescriptions  No medications on file     Lona Kettleshley Laurel Karis Emig, [place]PA-C 07/28/16 2037    Lona KettleAshley Laurel Airiel Oblinger, PA-C 07/28/16 2251    Mancel BaleElliott Wentz, MD 07/29/16 0001

## 2016-07-28 NOTE — ED Notes (Signed)
Walked with patient out to lobby, patient has all belongings at hand

## 2016-07-28 NOTE — BH Assessment (Addendum)
Tele Assessment Note   Sharon Gould is an 20 y.o. female.  -Clinicain reviewed note by Sharon Meres, PA.  Sharon Gould is a 20 y.o. female presents to ED with suicidal thoughts. Patient states for the last couple of weeks she has felt increasingly sad and depressed. She endorses suicidal thoughts, although she does not have a plan. She states she has dreams of herself dying. She reports hearing her voiced in her head telling her she is "stupid" and "nothing." She also states "I just want to go to sleep and not wake up."   Patient was alone at home and had suicidal thoughts.  She was crying and felt that she did not have anyone that cared about her.  She said that she did not want to feel depressed anymore.  Patient said that she lets her depression build up and this was the first time she felt like she wanted to die.  Patient does not currently endorse any suicidal thoughts.  Patient says that her boyfriend (with whom she lives) is supportive of her.  She is surprised at the outpouring of affection for her by family.  She says "I know they will support me."  She says that she feels she can turn to family for help and says she was so depressed she did not think of talking to them this time.  Patient says boyfriend had told her to call 911 if she was feeling very bad.  When asked about voices she says that she sometimes will hear her own voice in her head yelling at her.  Not a hallucination type of auditory situation.  Patient denies any A/V hallucinations and no HI.  Patient says she does usually seek out others when she is depressed it is that she was alone today.  Patient does admit to regular marijuana use.  She will take a xanax if someone has one.  This usually amounts to twice in a month use.    Patient says that she is starting training for a new job at the end of December and she is looking forward to it.  Patient feels that she would be safe to return home where her boyfriend is.  She feels  she can contract for safety at this time.  -Clinician discussed patient care with Sharon Sievert, PA.  Sharon Gould said that patient can sign no harm contract and be given outpatient resources.  Clinician spoke with Sharon Meres, PA who is in agreement with Sharon Gould disposition.  Patient was given outpatient resources and signed the no-harm contract prior to discharge home.  Diagnosis: MDD, recurrent moderate; Cannabis use d/o severe; Benzos use d/o moderate  Past Medical History: History reviewed. No pertinent past medical history.  No past surgical history on file.  Family History: No family history on file.  Social History:  reports that she has been smoking.  She has been smoking about 0.25 packs per day. She does not have any smokeless tobacco history on file. She reports that she drinks alcohol. She reports that she uses drugs, including Marijuana.  Additional Social History:  Alcohol / Drug Use Pain Medications: None Prescriptions: None Over the Counter: Midol when needed History of alcohol / drug use?: Yes Substance #1 Name of Substance 1: Marijuana 1 - Age of First Use: 20 years of age 55 - Amount (size/oz): One gram per day 1 - Frequency: Daily use 1 - Duration: On-going 1 - Last Use / Amount: 11/12 Substance #2 Name of Substance 2:  Xanax 2 - Age of First Use: 20 years of age 61 - Amount (size/oz): One pill at a time (does not know dosage) 2 - Frequency: About twice in a month.  Does not seek them out. 2 - Duration: Last year or so 2 - Last Use / Amount: 11/13  CIWA: CIWA-Ar BP: 128/78 Pulse Rate: 63 COWS:    PATIENT STRENGTHS: (choose at least two) Ability for insight Average or above average intelligence Capable of independent living Communication skills Supportive family/friends  Allergies: No Known Allergies  Home Medications:  (Not in a hospital admission)  OB/GYN Status:  Patient's last menstrual period was 07/06/2016 (approximate).  General Assessment  Data Location of Assessment: [facility] TTS Assessment: In system Is this a Tele or Face-to-Face Assessment?: Face-to-Face Is this an Initial Assessment or a Re-assessment for this encounter?: Initial Assessment Marital status: Long term relationship Is patient pregnant?: No Pregnancy Status: No Living Arrangements: Spouse/significant other (Stays with boyfriend) Can pt return to current living arrangement?: Yes Admission Status: Voluntary Is patient capable of signing voluntary admission?: Yes Referral Source: Self/Family/Friend (Pt called EMS for help.) Insurance type: MCD     Crisis Care Plan Living Arrangements: Spouse/significant other (Stays with boyfriend) Name of Psychiatrist: None Name of Therapist: None  Education Status Is patient currently in school?: Yes Current Grade: Going into [employer] in Spring Semester Highest grade of school patient has completed: 12th grade  Risk to self with the past 6 months Suicidal Ideation: Yes-Currently Present Has patient been a risk to self within the past 6 months prior to admission? : No Suicidal Intent: No Has patient had any suicidal intent within the past 6 months prior to admission? : No Is patient at risk for suicide?: Yes Suicidal Plan?: No Has patient had any suicidal plan within the past 6 months prior to admission? : Yes Access to Means: No What has been your use of drugs/alcohol within the last 12 months?: Benzos & THC Previous Attempts/Gestures: No How many times?: 0 Other Self Harm Risks: None Triggers for Past Attempts: None known Intentional Self Injurious Behavior: None Family Suicide History: No Recent stressful life event(s): Financial Problems, Job Loss (Recent job loss and ensuing financial problemsn) Persecutory voices/beliefs?: No Depression: Yes Depression Symptoms: Despondent, Insomnia, Loss of interest in usual pleasures, Guilt Substance abuse history and/or treatment for substance abuse?: Yes Suicide  prevention information given to non-admitted patients: Not applicable  Risk to Others within the past 6 months Homicidal Ideation: No Does patient have any lifetime risk of violence toward others beyond the six months prior to admission? : No Thoughts of Harm to Others: No Current Homicidal Intent: No Current Homicidal Plan: No Access to Homicidal Means: No Identified Victim: No one History of harm to others?: No Assessment of Violence: None Noted Violent Behavior Description: None noted Does patient have access to weapons?: No Criminal Charges Pending?: No Does patient have a court date: No Is patient on probation?: No  Psychosis Hallucinations: None noted Delusions: None noted  Mental Status Report Appearance/Hygiene: Unremarkable, In scrubs Eye Contact: Good Motor Activity: Freedom of movement, Unremarkable Speech: Logical/coherent Level of Consciousness: Alert Mood: Pleasant, Sad Affect: Sad Anxiety Level: Moderate (Pt will cry a lot at night.) Thought Processes: Coherent, Relevant Judgement: Unimpaired Orientation: Person, Place, Situation, Time Obsessive Compulsive Thoughts/Behaviors: None  Cognitive Functioning Concentration: Fair Memory: Recent Intact, Remote Intact IQ: Average Insight: Good Impulse Control: Fair Appetite: Poor Weight Loss:  (Not eating as much since stopping pot.) Weight Gain: 0  Sleep: No Change Total Hours of Sleep:  (4-6 hours.) Vegetative Symptoms: None  ADLScreening [facility] Assessment Services) Patient's cognitive ability adequate to safely complete daily activities?: Yes Patient able to express need for assistance with ADLs?: Yes Independently performs ADLs?: Yes (appropriate for developmental age)  Prior Inpatient Therapy Prior Inpatient Therapy: No Prior Therapy Dates: N/A Prior Therapy Facilty/Provider(s): N/A Reason for Treatment: N/A  Prior Outpatient Therapy Prior Outpatient Therapy: No Prior Therapy Dates: N/A Prior  Therapy Facilty/Provider(s): None Reason for Treatment: None Does patient have an ACCT team?: No Does patient have Intensive In-House Services?  : No Does patient have [facility] services? : No Does patient have [facility] services?: No  ADL Screening (condition at time of admission) Patient's cognitive ability adequate to safely complete daily activities?: Yes Is the patient deaf or have difficulty hearing?: No Does the patient have difficulty seeing, even when wearing glasses/contacts?: No Does the patient have difficulty concentrating, remembering, or making decisions?: No Patient able to express need for assistance with ADLs?: Yes Does the patient have difficulty dressing or bathing?: No Independently performs ADLs?: Yes (appropriate for developmental age) Does the patient have difficulty walking or climbing stairs?: No Weakness of Legs: None Weakness of Arms/Hands: None       Abuse/Neglect Assessment (Assessment to be complete while patient is alone) Physical Abuse: Denies Verbal Abuse: Denies Sexual Abuse: Denies Exploitation of patient/patient's resources: Denies Self-Neglect: Denies     Merchant navy officerAdvance Directives (For Healthcare) Does patient have an advance directive?: No Would patient like information on creating an advanced directive?: No - patient declined information    Additional Information 1:1 In Past 12 Months?: No CIRT Risk: No Elopement Risk: No Does patient have medical clearance?: Yes     Disposition:  Disposition Initial Assessment Completed for this Encounter: Yes Disposition of Patient: Other dispositions Other disposition(s): Other (Comment) (Pt to be reviewed with PA)  Beatriz StallionHarvey, Esmirna Ravan Ray 07/28/2016 9:48 PM

## 2016-07-28 NOTE — ED Notes (Signed)
Bed: [id number] Expected date:  Expected time:  Means of arrival:  Comments: Hold for 30

## 2016-09-20 ENCOUNTER — Ambulatory Visit (HOSPITAL_COMMUNITY)
Admission: EM | Admit: 2016-09-20 | Discharge: 2016-09-20 | Disposition: A | Discharge status: Home / Self Care | Payer: Medicaid Other | Attending: Emergency Medicine | Admitting: Emergency Medicine

## 2016-09-20 ENCOUNTER — Encounter (HOSPITAL_COMMUNITY): Payer: Self-pay | Admitting: Emergency Medicine

## 2016-09-20 DIAGNOSIS — K529 Noninfective gastroenteritis and colitis, unspecified: Secondary | ICD-10-CM

## 2016-09-20 MED ORDER — ONDANSETRON HCL 4 MG PO TABS: 4.0000 mg | ORAL_TABLET | Freq: Three times a day (TID) | ORAL | 0 refills | Status: DC | PRN

## 2016-09-20 MED ORDER — OMEPRAZOLE 20 MG PO CPDR: 20.0000 mg | DELAYED_RELEASE_CAPSULE | Freq: Every day | ORAL | 0 refills | Status: DC

## 2016-09-20 NOTE — ED Triage Notes (Signed)
[facility] a [facility] and N/V that started today.

## 2016-09-20 NOTE — ED Provider Notes (Signed)
[facility]    CSN: [id number] Arrival date & time: 09/20/16  1743     History   Chief Complaint Chief Complaint  Patient presents with  . Abdominal Cramping    HPI Sharon Gould is a 21 y.o. female.   HPI  She is a 21 year old woman here for evaluation of abdominal cramping. This started last night with abdominal cramping, nausea, vomiting. She reports a small amount of bright red blood in the vomit. She denies any diarrhea. She is able to tolerate liquids. No fevers. No known sick contacts. No questionable foods. She has not tried any medications. She does drink a lot of caffeinated sodas. She is also a half a pack a day smoker. Denies alcohol use.  History reviewed. No pertinent past medical history.  There are no active problems to display for this patient.   History reviewed. No pertinent surgical history.  OB History    Gravida Para Term Preterm AB Living   0             SAB TAB Ectopic Multiple Live Births                   Home Medications    Prior to Admission medications   Medication Sig Start Date End Date Taking? Authorizing Provider  omeprazole (PRILOSEC) 20 MG capsule Take 1 capsule (20 mg total) by mouth daily. 09/20/16   Charm Rings, MD  ondansetron (ZOFRAN) 4 MG tablet Take 1 tablet (4 mg total) by mouth every 8 (eight) hours as needed for nausea or vomiting. 09/20/16   Charm Rings, MD    Family History History reviewed. No pertinent family history.  Social History Social History  Substance Use Topics  . Smoking status: Current Every Day Smoker    Packs/day: 0.25  . Smokeless tobacco: Not on file  . Alcohol use Yes     Comment: occasionally     Allergies   Patient has no known allergies.   Review of Systems Review of Systems As in history of present illness  Physical Exam Triage Vital Signs ED Triage Vitals  Enc Vitals Group     BP 09/20/16 1928 145/87     Pulse Rate 09/20/16 1928 80     Resp 09/20/16 1928 16     Temp  09/20/16 1928 98.5 F (36.9 C)     Temp Source 09/20/16 1928 Oral     SpO2 09/20/16 1928 100 %     Weight --      Height --      Head Circumference --      Peak Flow --      Pain Score 09/20/16 1931 7     Pain Loc --      Pain Edu? --      Excl. in GC? --    No data found.   Updated Vital Signs BP 145/87 (BP Location: Left Arm)   Pulse 80   Temp 98.5 F (36.9 C) (Oral)   Resp 16   LMP 09/07/2016 (Exact Date)   SpO2 100%   Visual Acuity Right Eye Distance:   Left Eye Distance:   Bilateral Distance:    Right Eye Near:   Left Eye Near:    Bilateral Near:     Physical Exam  Constitutional: She is oriented to person, place, and time. She appears well-developed and well-nourished. No distress.  Neck: Neck supple.  Cardiovascular: Normal rate, regular rhythm and normal heart sounds.  No murmur heard. Pulmonary/Chest: Effort normal and breath sounds normal. No respiratory distress. She has no wheezes. She has no rales.  Abdominal: Soft. Bowel sounds are normal. She exhibits no distension (diffuse and mild). There is tenderness. There is no rebound and no guarding.  Neurological: She is alert and oriented to person, place, and time.     UC Treatments / Results  Labs (all labs ordered are listed, but only abnormal results are displayed) Labs Reviewed - No data to display  EKG  EKG Interpretation None       Radiology No results found.  Procedures Procedures (including critical care time)  Medications Ordered in UC Medications - No data to display   Initial Impression / Assessment and Plan / UC Course  I have reviewed the triage vital signs and the nursing notes.  Pertinent labs & imaging results that were available during my care of the patient were reviewed by me and considered in my medical decision making (see chart for details).  Clinical Course     Symptomatic treatment with omeprazole and Zofran. This may be more of a gastritis rather than a  gastroenteritis given her soda intake and smoking. Recommended stopping these.  Final Clinical Impressions(s) / UC Diagnoses   Final diagnoses:  Gastroenteritis    New Prescriptions New Prescriptions   OMEPRAZOLE (PRILOSEC) 20 MG CAPSULE    Take 1 capsule (20 mg total) by mouth daily.   ONDANSETRON (ZOFRAN) 4 MG TABLET    Take 1 tablet (4 mg total) by mouth every 8 (eight) hours as needed for nausea or vomiting.     Charm RingsErin J Honig, MD 09/20/16 2051

## 2016-09-20 NOTE — Discharge Instructions (Signed)
You likely have a stomach bug. He did also have some irritation of the stomach from smoking and drinking too much soda. Avoid any dark sodas for the next week. It is okay to drink Sprite or ginger ale. Take omeprazole daily for 2 weeks. This is an acid blocker. Use Zofran every 8 hours as needed for nausea or vomiting. You will likely develop some diarrhea over the next few days. Follow-up as needed.

## 2016-12-09 ENCOUNTER — Ambulatory Visit (INDEPENDENT_AMBULATORY_CARE_PROVIDER_SITE_OTHER): Payer: Medicaid Other

## 2016-12-09 ENCOUNTER — Encounter (HOSPITAL_COMMUNITY): Payer: Self-pay | Admitting: Emergency Medicine

## 2016-12-09 ENCOUNTER — Ambulatory Visit (HOSPITAL_COMMUNITY)
Admission: EM | Admit: 2016-12-09 | Discharge: 2016-12-09 | Disposition: A | Discharge status: Home / Self Care | Payer: Medicaid Other | Attending: Family Medicine | Admitting: Family Medicine

## 2016-12-09 DIAGNOSIS — M25572 Pain in left ankle and joints of left foot: Secondary | ICD-10-CM | POA: Diagnosis not present

## 2016-12-09 DIAGNOSIS — S8262XA Displaced fracture of lateral malleolus of left fibula, initial encounter for closed fracture: Secondary | ICD-10-CM

## 2016-12-09 MED ORDER — HYDROCODONE-ACETAMINOPHEN 5-325 MG PO TABS: 1.0000 | ORAL_TABLET | ORAL | 0 refills | Status: DC | PRN

## 2016-12-09 NOTE — ED Notes (Signed)
Verified with bill oxford, np the type of ankle support needed.  Ace wrap is the order

## 2016-12-09 NOTE — ED Notes (Signed)
Left pedal pulse present, warm to touch, able to move toes.  Left ankle with swelling and pain

## 2016-12-09 NOTE — ED Triage Notes (Signed)
Slipped getting out of shower and landed on left ankle/foot.

## 2016-12-09 NOTE — ED Provider Notes (Signed)
CSN: [id number]     Arrival date & time 12/09/16  1409 History   First MD Initiated Contact with Patient 12/09/16 1426     Chief Complaint  Patient presents with  . Ankle Pain   (Consider location/radiation/quality/duration/timing/severity/associated sxs/prior Treatment) Patient slipped getting out of shower and landed on left ankle/foot.     The history is provided by the patient.  Ankle Pain  Location:  Ankle Time since incident:  1 hour Injury: yes   Ankle location:  L ankle Pain details:    Quality:  Aching   Severity:  Moderate   Onset quality:  Sudden   Duration:  1 hour   Timing:  Constant Chronicity:  New Dislocation: no   Foreign body present:  No foreign bodies Tetanus status:  Unknown Prior injury to area:  No Relieved by:  None tried Worsened by:  Nothing Ineffective treatments:  None tried   History reviewed. No pertinent past medical history. History reviewed. No pertinent surgical history. No family history on file. Social History  Substance Use Topics  . Smoking status: Current Every Day Smoker    Packs/day: 0.25  . Smokeless tobacco: Not on file  . Alcohol use Yes     Comment: occasionally   OB History    Gravida Para Term Preterm AB Living   0             SAB TAB Ectopic Multiple Live Births                 Review of Systems  Constitutional: Negative.   HENT: Negative.   Eyes: Negative.   Respiratory: Negative.   Cardiovascular: Negative.   Gastrointestinal: Negative.   Endocrine: Negative.   Genitourinary: Negative.   Musculoskeletal: Positive for arthralgias.  Allergic/Immunologic: Negative.   Neurological: Negative.   Hematological: Negative.   Psychiatric/Behavioral: Negative.     Allergies  Patient has no known allergies.  Home Medications   Prior to Admission medications   Medication Sig Start Date End Date Taking? Authorizing Provider  HYDROcodone-acetaminophen (NORCO/VICODIN) 5-325 MG tablet Take 1-2 tablets by mouth  every 4 (four) hours as needed. 12/09/16   Deatra CanterWilliam J Candy Leverett, FNP  omeprazole (PRILOSEC) 20 MG capsule Take 1 capsule (20 mg total) by mouth daily. 09/20/16   Charm RingsErin J Honig, MD  ondansetron (ZOFRAN) 4 MG tablet Take 1 tablet (4 mg total) by mouth every 8 (eight) hours as needed for nausea or vomiting. 09/20/16   Charm RingsErin J Honig, MD   Meds Ordered and Administered this Visit  Medications - No data to display  BP (!) 149/74 (BP Location: Left Arm)   Pulse 92   Temp 98.6 F (37 C) (Oral)   Resp 18   LMP 11/25/2016   SpO2 97%  No data found.   Physical Exam  Constitutional: She is oriented to person, place, and time. She appears well-developed and well-nourished.  HENT:  Head: Normocephalic and atraumatic.  Eyes: Conjunctivae and EOM are normal. Pupils are equal, round, and reactive to light.  Neck: Normal range of motion. Neck supple.  Cardiovascular: Normal rate, regular rhythm and normal heart sounds.   Pulmonary/Chest: Effort normal and breath sounds normal.  Musculoskeletal: She exhibits tenderness.  Left lateral ankle swollen and tender.  Neurological: She is alert and oriented to person, place, and time.  Nursing note and vitals reviewed.   Urgent Care Course     Procedures (including critical care time)  Labs Review Labs Reviewed - No data to display  Imaging Review Dg Ankle Complete Left  Result Date: 12/09/2016 CLINICAL DATA:  Left ankle injury getting out of the shower yesterday. Pain and swelling on the lateral side. Initial encounter. EXAM: LEFT ANKLE COMPLETE - 3+ VIEW COMPARISON:  None. FINDINGS: The patient has a fracture of the distal fibula which is oblique in orientation extending from just above the cortex of the distal diaphysis posteriorly in an anterior and inferior orientation through the anterior aspect of the lateral malleolus. The fracture is mildly distracted. Associated soft tissue swelling is noted. No other acute bony abnormality IMPRESSION: Acute mildly  distracted distal fibular fracture as described. Electronically Signed   By: Drusilla Kanner M.D.   On: 12/09/2016 14:52     Visual Acuity Review  Right Eye Distance:   Left Eye Distance:   Bilateral Distance:    Right Eye Near:   Left Eye Near:    Bilateral Near:         MDM   1. Closed displaced fracture of lateral malleolus of left fibula, initial encounter    ACE Wrap Left ankle Crutches Norco 5/325 one to two po q 6 hours prn #20 Follow up with Dr. Gigi Gin Next week.      Deatra Canter, FNP 12/09/16 1538

## 2017-06-19 ENCOUNTER — Emergency Department (HOSPITAL_COMMUNITY)
Admission: EM | Admit: 2017-06-19 | Discharge: 2017-06-19 | Disposition: A | Discharge status: Home / Self Care | Payer: Self-pay | Attending: Emergency Medicine | Admitting: Emergency Medicine

## 2017-06-19 ENCOUNTER — Encounter (HOSPITAL_COMMUNITY): Payer: Self-pay | Admitting: Emergency Medicine

## 2017-06-19 DIAGNOSIS — Z113 Encounter for screening for infections with a predominantly sexual mode of transmission: Secondary | ICD-10-CM | POA: Insufficient documentation

## 2017-06-19 DIAGNOSIS — R197 Diarrhea, unspecified: Secondary | ICD-10-CM | POA: Insufficient documentation

## 2017-06-19 DIAGNOSIS — R1031 Right lower quadrant pain: Secondary | ICD-10-CM | POA: Insufficient documentation

## 2017-06-19 DIAGNOSIS — Z79899 Other long term (current) drug therapy: Secondary | ICD-10-CM | POA: Insufficient documentation

## 2017-06-19 DIAGNOSIS — F17200 Nicotine dependence, unspecified, uncomplicated: Secondary | ICD-10-CM | POA: Insufficient documentation

## 2017-06-19 LAB — COMPREHENSIVE METABOLIC PANEL
ALT: 29 U/L (ref 14–54)
AST: 26 U/L (ref 15–41)
Albumin: 3.7 g/dL (ref 3.5–5.0)
Alkaline Phosphatase: 53 U/L (ref 38–126)
Anion gap: 8 (ref 5–15)
BUN: 7 mg/dL (ref 6–20)
CALCIUM: 8.7 mg/dL — AB (ref 8.9–10.3)
CHLORIDE: 105 mmol/L (ref 101–111)
CO2: 24 mmol/L (ref 22–32)
CREATININE: 0.8 mg/dL (ref 0.44–1.00)
Glucose, Bld: 97 mg/dL (ref 65–99)
Potassium: 4.1 mmol/L (ref 3.5–5.1)
Sodium: 137 mmol/L (ref 135–145)
TOTAL PROTEIN: 6.6 g/dL (ref 6.5–8.1)
Total Bilirubin: 0.4 mg/dL (ref 0.3–1.2)

## 2017-06-19 LAB — CBC
HCT: 42.9 % (ref 36.0–46.0)
Hemoglobin: 13.8 g/dL (ref 12.0–15.0)
MCH: 25 pg — ABNORMAL LOW (ref 26.0–34.0)
MCHC: 32.2 g/dL (ref 30.0–36.0)
MCV: 77.6 fL — AB (ref 78.0–100.0)
PLATELETS: 299 10*3/uL (ref 150–400)
RBC: 5.53 MIL/uL — AB (ref 3.87–5.11)
RDW: 13.8 % (ref 11.5–15.5)
WBC: 9.8 10*3/uL (ref 4.0–10.5)

## 2017-06-19 LAB — URINALYSIS, ROUTINE W REFLEX MICROSCOPIC
Bilirubin Urine: NEGATIVE
Glucose, UA: NEGATIVE mg/dL
Hgb urine dipstick: NEGATIVE
Ketones, ur: NEGATIVE mg/dL
LEUKOCYTES UA: NEGATIVE
NITRITE: NEGATIVE
PROTEIN: NEGATIVE mg/dL
Specific Gravity, Urine: 1.015 (ref 1.005–1.030)
pH: 6 (ref 5.0–8.0)

## 2017-06-19 LAB — LIPASE, BLOOD: Lipase: 35 U/L (ref 11–51)

## 2017-06-19 LAB — POC URINE PREG, ED: PREG TEST UR: NEGATIVE

## 2017-06-19 MED ORDER — NAPROXEN 500 MG PO TABS: 500.0000 mg | ORAL_TABLET | Freq: Two times a day (BID) | ORAL | 0 refills | Status: DC

## 2017-06-19 MED ORDER — LOPERAMIDE HCL 2 MG PO CAPS: 2.0000 mg | ORAL_CAPSULE | Freq: Four times a day (QID) | ORAL | 0 refills | Status: DC | PRN

## 2017-06-19 NOTE — Discharge Instructions (Signed)
Return to the emergency room for worsening symptoms, fever, vomiting. Take the medications as needed. Follow-up with your doctor later in the  week if the symptoms have not resolved

## 2017-06-19 NOTE — ED Notes (Signed)
Pelvic Cart set up at bedside. 

## 2017-06-19 NOTE — ED Provider Notes (Signed)
[facility]EMER[facility]ENC[facility] DE[facility] Provider Note   CSN: [id number] Arrival date & time: 06/19/17  0900     History   Chief Complaint Chief Complaint  Patient presents with  . Abdominal Pain    HPI Sharon Gould is a 21 y.o. female.  HPI Patient presents to the emergency room for evaluation of right-sided lower abdominal pain associated with diarrhea. Patient states her symptoms started last evening. She had 3 loose watery stools. She had right lower abdominal cramping at the same time. She denies any nausea or vomiting. She denies any dysuria. No vaginal discharge or bleeding.  Patient mentioned wanting to get checked for STDs but she denies any specific complaints.  History reviewed. No pertinent past medical history.  There are no active problems to display for this patient.   History reviewed. No pertinent surgical history.  OB History    Gravida Para Term Preterm AB Living   0             SAB TAB Ectopic Multiple Live Births                   Home Medications    Prior to Admission medications   Medication Sig Start Date End Date Taking? Authorizing Provider  HYDROcodone-acetaminophen (NORCO/VICODIN) 5-325 MG tablet Take 1-2 tablets by mouth every 4 (four) hours as needed. 12/09/16   Deatra Canter, FNP  loperamide (IMODIUM) 2 MG capsule Take 1 capsule (2 mg total) by mouth 4 (four) times daily as needed for diarrhea or loose stools. 06/19/17   Linwood Dibbles, MD  naproxen (NAPROSYN) 500 MG tablet Take 1 tablet (500 mg total) by mouth 2 (two) times daily. 06/19/17   Linwood Dibbles, MD  omeprazole (PRILOSEC) 20 MG capsule Take 1 capsule (20 mg total) by mouth daily. 09/20/16   Charm Rings, MD  ondansetron (ZOFRAN) 4 MG tablet Take 1 tablet (4 mg total) by mouth every 8 (eight) hours as needed for nausea or vomiting. 09/20/16   Charm Rings, MD    Family History History reviewed. No pertinent family history.  Social History Social History  Substance Use Topics  . Smoking status:  Current Every Day Smoker    Packs/day: 0.25  . Smokeless tobacco: Not on file  . Alcohol use Yes     Comment: occasionally     Allergies   Patient has no known allergies.   Review of Systems Review of Systems  All other systems reviewed and are negative.    Physical Exam Updated Vital Signs BP (!) 150/97   Pulse 68   Temp 98.3 F (36.8 C) (Oral)   Resp 16   SpO2 100%   Physical Exam  Constitutional: She appears well-developed and well-nourished. No distress.  HENT:  Head: Normocephalic and atraumatic.  Right Ear: External ear normal.  Left Ear: External ear normal.  Eyes: Conjunctivae are normal. Right eye exhibits no discharge. Left eye exhibits no discharge. No scleral icterus.  Neck: Neck supple. No tracheal deviation present.  Cardiovascular: Normal rate, regular rhythm and intact distal pulses.   Pulmonary/Chest: Effort normal and breath sounds normal. No stridor. No respiratory distress. She has no wheezes. She has no rales.  Abdominal: Soft. Bowel sounds are normal. She exhibits no distension. There is no tenderness. There is no rebound and no guarding.  Musculoskeletal: She exhibits no edema or tenderness.  Neurological: She is alert. She has normal strength. No cranial nerve deficit (no facial droop, extraocular movements intact, no slurred speech)  or sensory deficit. She exhibits normal muscle tone. She displays no seizure activity. Coordination normal.  Skin: Skin is warm and dry. No rash noted.  Psychiatric: She has a normal mood and affect.  Nursing note and vitals reviewed.    ED Treatments / Results  Labs (all labs ordered are listed, but only abnormal results are displayed) Labs Reviewed  COMPREHENSIVE METABOLIC PANEL - Abnormal; Notable for the following:       Result Value   Calcium 8.7 (*)    All other components within normal limits  CBC - Abnormal; Notable for the following:    RBC 5.53 (*)    MCV 77.6 (*)    MCH 25.0 (*)    All other  components within normal limits  URINALYSIS, ROUTINE W REFLEX MICROSCOPIC - Abnormal; Notable for the following:    Color, Urine STRAW (*)    All other components within normal limits  LIPASE, BLOOD  HIV ANTIBODY (ROUTINE TESTING)  RPR  POC URINE PREG, ED  GC/CHLAMYDIA PROBE AMP ([place]) NOT AT [facility]     Radiology No results found.  Procedures Procedures (including critical care time)  Medications Ordered in ED Medications - No data to display   Initial Impression / Assessment and Plan / ED Course  I have reviewed the triage vital signs and the nursing notes.  Pertinent labs & imaging results that were available during my care of the patient were reviewed by me and considered in my medical decision making (see chart for details).  Clinical Course as of Jun 19 1132  Wynelle Link Jun 19, 2017  1610 No abdominal ttp on exam.  No vaginal discharge or other pevlic complaints.   No indications for pelvic exam at this time.  Will add on std panel per pt request.  Will send CG and Chlamydia off urine  [JK]    Clinical Course User Index [JK] Linwood Dibbles, MD   Pt's exam is benign.  No abdominal ttp.  No abd pain now.  Labs are reassuring.  STD panel sent per patient request.  Discussed warning signs and precautions.  At this time there does not appear to be any evidence of an acute emergency medical condition and the patient appears stable for discharge with appropriate outpatient follow up.   Final Clinical Impressions(s) / ED Diagnoses   Final diagnoses:  Right lower quadrant abdominal pain  Diarrhea of presumed infectious origin    New Prescriptions New Prescriptions   LOPERAMIDE (IMODIUM) 2 MG CAPSULE    Take 1 capsule (2 mg total) by mouth 4 (four) times daily as needed for diarrhea or loose stools.   NAPROXEN (NAPROSYN) 500 MG TABLET    Take 1 tablet (500 mg total) by mouth 2 (two) times daily.     Linwood Dibbles, MD 06/19/17 1134

## 2017-06-19 NOTE — ED Notes (Signed)
RN starting a line and collecting blood work. 

## 2017-06-19 NOTE — ED Triage Notes (Addendum)
Pt reports she began having R side abd pain accompanied by diarrhea that began last night. Also has throbbing with urination. No n/v.  Pt also wants to be checked for STDs. No vaginal symptoms.

## 2017-06-20 LAB — HIV ANTIBODY (ROUTINE TESTING W REFLEX): HIV SCREEN 4TH GENERATION: NONREACTIVE

## 2017-06-20 LAB — RPR: RPR: NONREACTIVE

## 2017-07-25 ENCOUNTER — Encounter (HOSPITAL_COMMUNITY): Payer: Self-pay

## 2017-07-25 ENCOUNTER — Inpatient Hospital Stay (HOSPITAL_COMMUNITY)
Admission: AD | Admit: 2017-07-25 | Discharge: 2017-07-25 | Disposition: A | Discharge status: Home / Self Care | Payer: Self-pay | Source: Ambulatory Visit | Attending: Family Medicine | Admitting: Family Medicine

## 2017-07-25 ENCOUNTER — Other Ambulatory Visit: Payer: Self-pay

## 2017-07-25 DIAGNOSIS — R109 Unspecified abdominal pain: Secondary | ICD-10-CM | POA: Insufficient documentation

## 2017-07-25 DIAGNOSIS — Z5321 Procedure and treatment not carried out due to patient leaving prior to being seen by health care provider: Secondary | ICD-10-CM | POA: Insufficient documentation

## 2017-07-25 LAB — URINALYSIS, ROUTINE W REFLEX MICROSCOPIC
BILIRUBIN URINE: NEGATIVE
Glucose, UA: NEGATIVE mg/dL
Hgb urine dipstick: NEGATIVE
Ketones, ur: NEGATIVE mg/dL
LEUKOCYTES UA: NEGATIVE
NITRITE: NEGATIVE
PH: 6 (ref 5.0–8.0)
Protein, ur: NEGATIVE mg/dL
SPECIFIC GRAVITY, URINE: 1.02 (ref 1.005–1.030)

## 2017-07-25 LAB — POCT PREGNANCY, URINE: Preg Test, Ur: NEGATIVE

## 2017-07-25 NOTE — MAU Note (Signed)
Patient not in lobby

## 2017-07-25 NOTE — MAU Note (Signed)
Pt states that she started having abdominal cramping 3 days ago. She rates the pain 7/10 and constant.

## 2018-01-09 IMAGING — DX DG ANKLE COMPLETE 3+V*L*
4 series · 4 of 4 positions shown · non-contrast
Comparison: None.

CLINICAL DATA: Left ankle injury getting out of the shower
yesterday. Pain and swelling on the lateral side. Initial encounter.

EXAM:
LEFT ANKLE COMPLETE - 3+ VIEW

[ankle ap]
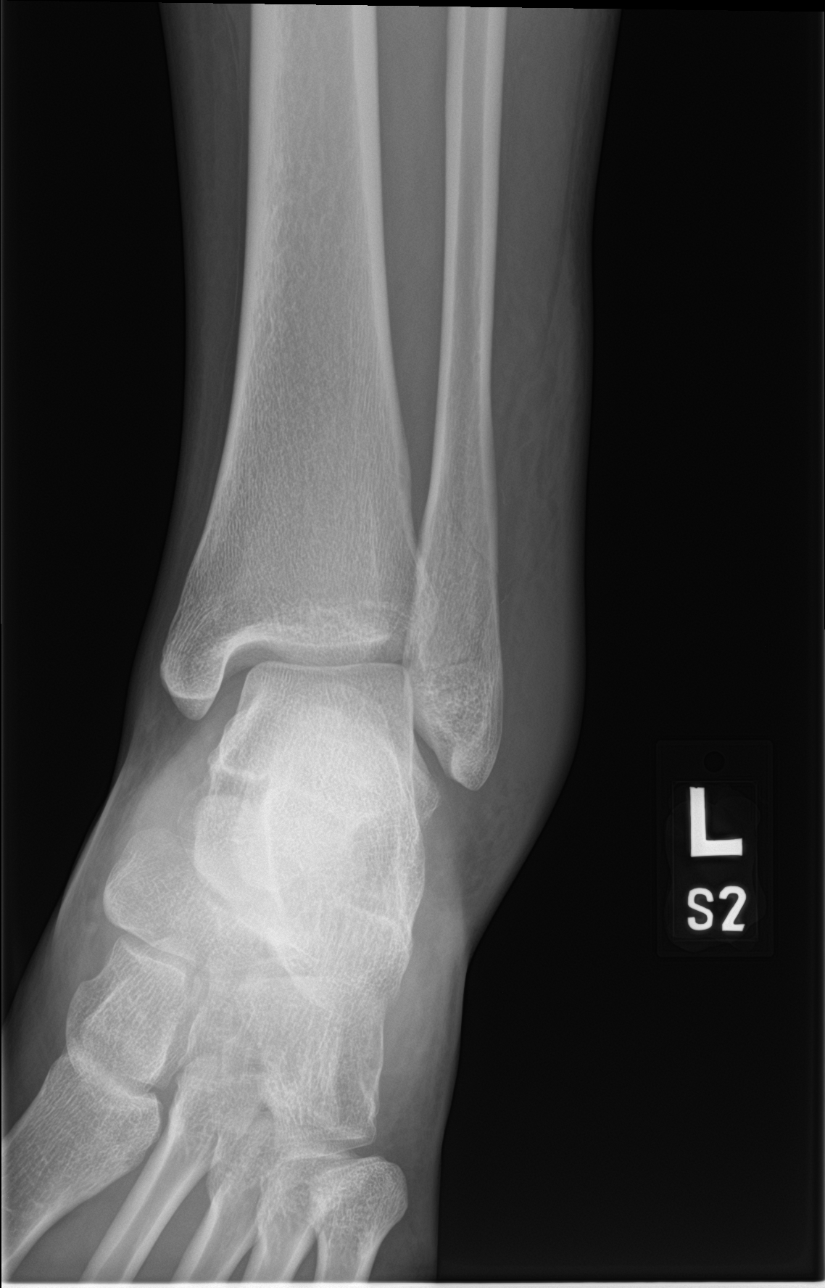

[ankle obl (1 of 2)]
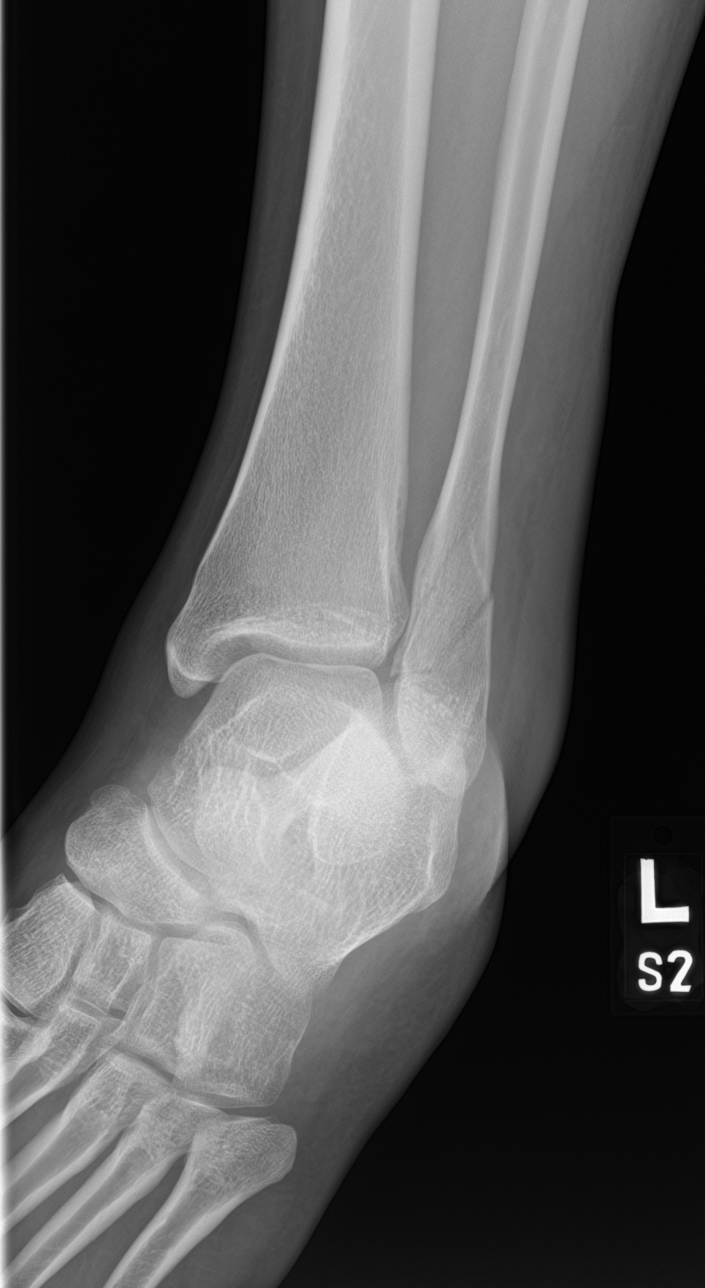

[ankle lat]
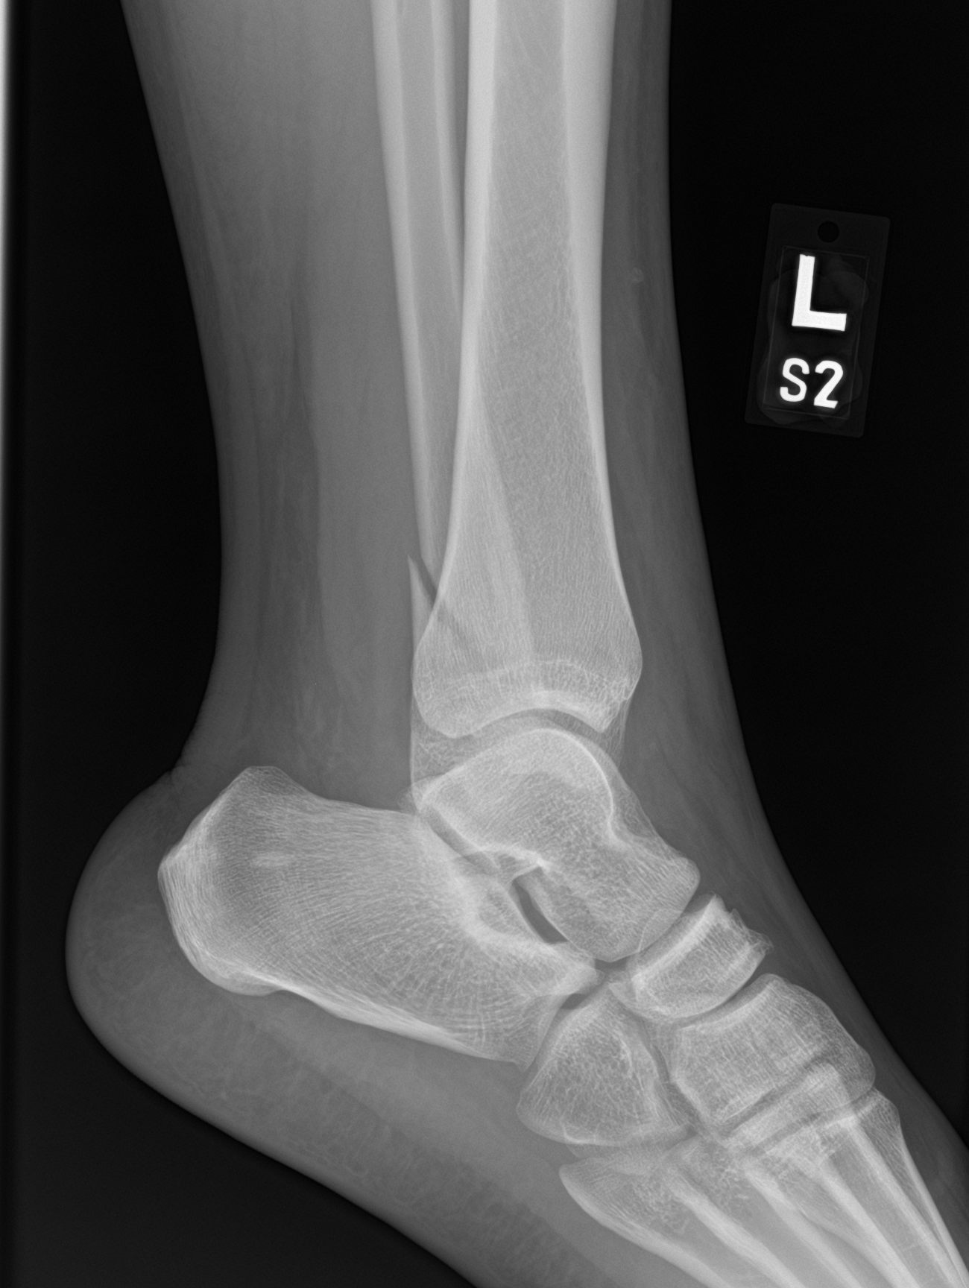

[ankle obl (2 of 2)]
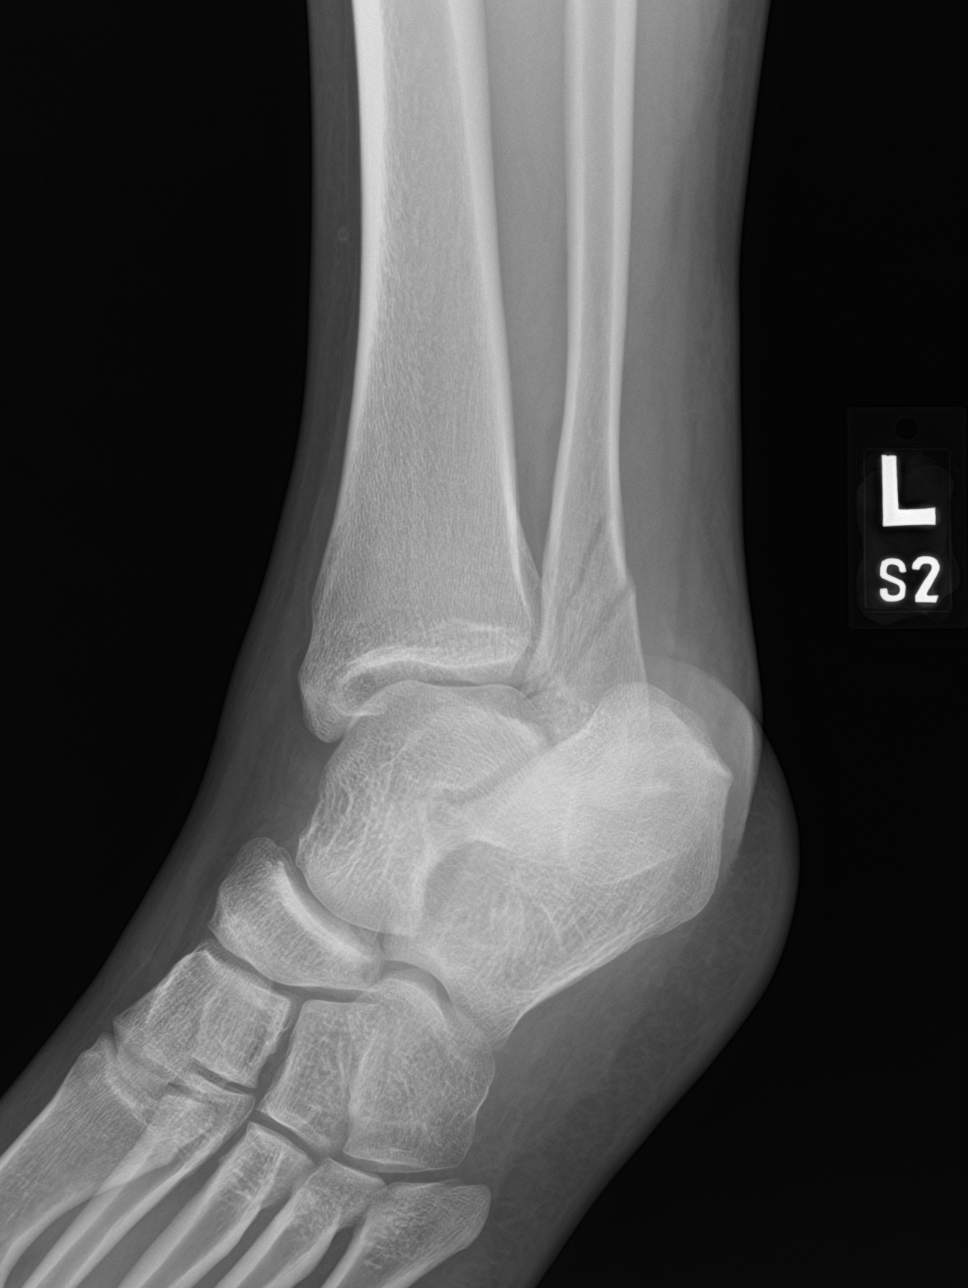

[4 of 4 positions shown; findings below may reference images not displayed]

FINDINGS: The patient has a fracture of the distal fibula which is oblique in
orientation extending from just above the cortex of the distal
diaphysis posteriorly in an anterior and inferior orientation
through the anterior aspect of the lateral malleolus. The fracture
is mildly distracted. Associated soft tissue swelling is noted. No
other acute bony abnormality
IMPRESSION: Acute mildly distracted distal fibular fracture as described.

## 2018-11-24 ENCOUNTER — Ambulatory Visit (HOSPITAL_COMMUNITY)
Admission: EM | Admit: 2018-11-24 | Discharge: 2018-11-24 | Disposition: A | Discharge status: Home / Self Care | Payer: Self-pay | Attending: Family Medicine | Admitting: Family Medicine

## 2018-11-24 ENCOUNTER — Other Ambulatory Visit: Payer: Self-pay

## 2018-11-24 ENCOUNTER — Encounter (HOSPITAL_COMMUNITY): Payer: Self-pay | Admitting: Emergency Medicine

## 2018-11-24 DIAGNOSIS — J020 Streptococcal pharyngitis: Secondary | ICD-10-CM

## 2018-11-24 LAB — POCT RAPID STREP A: Streptococcus, Group A Screen (Direct): POSITIVE — AB

## 2018-11-24 MED ORDER — AMOXICILLIN 500 MG PO CAPS: 500.0000 mg | ORAL_CAPSULE | Freq: Two times a day (BID) | ORAL | 0 refills | Status: AC

## 2018-11-24 NOTE — Discharge Instructions (Signed)
Strep was positive.   Drink plenty of water and rest Prescribed amoxicillin 500mg  twice daily for 10 days.  Take as directed and to completion.  You may use OTC zyrtec and/or flonase as needed for congestion and runny nose Perform salt water gargles at home, throat lozenges, you may drink warm or cold liquids Follow up with PCP if symptoms persist Return or go to ER if you have any new or worsening symptoms fever, chills, shortness of breath, wheezing, chest pain, changes in bowel or bladder habits, etc..Marland Kitchen

## 2018-11-24 NOTE — ED Provider Notes (Signed)
[facility]   [id number] 11/24/18 Arrival Time: 1709   CC: URI symptoms   SUBJECTIVE: History from: patient.  Sharon Gould is a 23 y.o. female who presents with abrupt onset of nasal congestion, runny nose, sore throat, body aches, nausea, and 1 episode of vomiting that began 2 days ago.  Denies known sick exposure or precipitating event.  Has NOT tried OTC medications.  Denies previous symptoms in the past.   Denies fever, chills, SOB, wheezing, chest pain, changes in bowel or bladder habits.    Received flu shot this year: no.  ROS: As per HPI.  History reviewed. No pertinent past medical history. History reviewed. No pertinent surgical history. No Known Allergies No current facility-administered medications on file prior to encounter.    No current outpatient medications on file prior to encounter.   Social History   Socioeconomic History  . Marital status: Single    Spouse name: Not on file  . Number of children: Not on file  . Years of education: Not on file  . Highest education level: Not on file  Occupational History  . Not on file  Social Needs  . Financial resource strain: Not on file  . Food insecurity:    Worry: Not on file    Inability: Not on file  . Transportation needs:    Medical: Not on file    Non-medical: Not on file  Tobacco Use  . Smoking status: Current Every Day Smoker    Packs/day: 0.25    Types: Cigarettes  . Smokeless tobacco: Never Used  Substance and Sexual Activity  . Alcohol use: Yes    Comment: occasionally  . Drug use: Yes    Types: Marijuana  . Sexual activity: Yes    Birth control/protection: None  Lifestyle  . Physical activity:    Days per week: Not on file    Minutes per session: Not on file  . Stress: Not on file  Relationships  . Social connections:    Talks on phone: Not on file    Gets together: Not on file    Attends religious service: Not on file    Active member of club or organization: Not on file     Attends meetings of clubs or organizations: Not on file    Relationship status: Not on file  . Intimate partner violence:    Fear of current or ex partner: Not on file    Emotionally abused: Not on file    Physically abused: Not on file    Forced sexual activity: Not on file  Other Topics Concern  . Not on file  Social History Narrative  . Not on file   Family History  Problem Relation Age of Onset  . Healthy Mother   . Hypertension Father     OBJECTIVE:  Vitals:   11/24/18 1832  BP: (!) 148/80  Pulse: 80  Resp: 18  Temp: 99.4 F (37.4 C)  TempSrc: Oral  SpO2: 98%     General appearance: alert; appears fatigued, but nontoxic; speaking in full sentences and tolerating own secretions HEENT: NCAT; Ears: EACs clear, TMs pearly gray; Eyes: PERRL.  EOM grossly intact. Nose: nares patent without rhinorrhea, Throat: oropharynx clear, tonsils mildly erythematous and enlarged, without exudates, uvula midline  Neck: supple without LAD Lungs: unlabored respirations, symmetrical air entry; cough: absent; no respiratory distress; CTAB Heart: regular rate and rhythm.  Radial pulses 2+ symmetrical bilaterally Skin: warm and dry Psychological: alert and cooperative; normal  mood and affect  Results for orders placed or performed during the hospital encounter of 11/24/18 (from the past 24 hour(s))  POCT rapid strep A [facility])     Status: Abnormal   Collection Time: 11/24/18  7:02 PM  Result Value Ref Range   Streptococcus, Group A Screen (Direct) POSITIVE (A) NEGATIVE     ASSESSMENT & PLAN:  1. Strep pharyngitis     Meds ordered this encounter  Medications  . amoxicillin (AMOXIL) 500 MG capsule    Sig: Take 1 capsule (500 mg total) by mouth 2 (two) times daily for 10 days.    Dispense:  20 capsule    Refill:  0    Order Specific Question:   Supervising Provider    Answer:   Eustace Moore [[id number]]    Strep was positive.   Drink plenty of water and rest  Prescribed amoxicillin 500mg  twice daily for 10 days.  Take as directed and to completion.  You may use OTC zyrtec and/or flonase as needed for congestion and runny nose Perform salt water gargles at home, throat lozenges, you may drink warm or cold liquids Follow up with PCP if symptoms persist Return or go to ER if you have any new or worsening symptoms fever, chills, shortness of breath, wheezing, chest pain, changes in bowel or bladder habits, etc...  Reviewed expectations re: course of current medical issues. Questions answered. Outlined signs and symptoms indicating need for more acute intervention. Patient verbalized understanding. After Visit Summary given.         Rennis Harding, PA-C 11/24/18 1926

## 2018-11-24 NOTE — ED Triage Notes (Signed)
Pt complains of body aches, headache, and sore throat x2 days.    Pt has not been taking any medication for her symptoms.

## 2019-06-18 ENCOUNTER — Other Ambulatory Visit: Payer: Self-pay

## 2019-06-18 ENCOUNTER — Ambulatory Visit (HOSPITAL_COMMUNITY)
Admission: EM | Admit: 2019-06-18 | Discharge: 2019-06-18 | Disposition: A | Discharge status: Home / Self Care | Payer: Self-pay | Attending: Family Medicine | Admitting: Family Medicine

## 2019-06-18 ENCOUNTER — Encounter (HOSPITAL_COMMUNITY): Payer: Self-pay | Admitting: Emergency Medicine

## 2019-06-18 DIAGNOSIS — R1030 Lower abdominal pain, unspecified: Secondary | ICD-10-CM | POA: Insufficient documentation

## 2019-06-18 DIAGNOSIS — Z3202 Encounter for pregnancy test, result negative: Secondary | ICD-10-CM | POA: Insufficient documentation

## 2019-06-18 LAB — POCT URINALYSIS DIP (DEVICE)
Bilirubin Urine: NEGATIVE
Glucose, UA: NEGATIVE mg/dL
Hgb urine dipstick: NEGATIVE
Ketones, ur: NEGATIVE mg/dL
Leukocytes,Ua: NEGATIVE
Nitrite: NEGATIVE
Protein, ur: NEGATIVE mg/dL
Specific Gravity, Urine: >=1.03 (ref 1.005–1.030)
Urobilinogen, UA: 0.2 mg/dL (ref 0.0–1.0)
pH: 6 (ref 5.0–8.0)

## 2019-06-18 LAB — POCT PREGNANCY, URINE: Preg Test, Ur: NEGATIVE

## 2019-06-18 MED ORDER — NAPROXEN 500 MG PO TABS: 500.0000 mg | ORAL_TABLET | Freq: Two times a day (BID) | ORAL | 0 refills | Status: DC

## 2019-06-18 NOTE — ED Triage Notes (Signed)
Pt sts abd cramping like worse menstrual cramps; pt sts LMP was 8/30

## 2019-06-18 NOTE — ED Provider Notes (Signed)
[place]    CSN: [id number] Arrival date & time: 06/18/19  1504      History   Chief Complaint Chief Complaint  Patient presents with  . Abdominal Cramping    HPI Sharon Gould is a 23 y.o. female.   HPI  Patient states she has suprapubic crampy pain on and off for 2 weeks.  No dysuria.  No frequency.  Some nausea, no vomiting.  No constipation or diarrhea.  Last bowel movement today.  No fever or chills.  Last menstrual period was 5 weeks ago.  She states she has had unprotected sexual relations.  He does exist the possibility she could have an STD.  She is not having any vaginal discharge or discomfort.  She is not using birth control.  History reviewed. No pertinent past medical history.  There are no active problems to display for this patient.   History reviewed. No pertinent surgical history.  OB History    Gravida  0   Para      Term      Preterm      AB      Living        SAB      TAB      Ectopic      Multiple      Live Births               Home Medications    Prior to Admission medications   Medication Sig Start Date End Date Taking? Authorizing Provider  naproxen (NAPROSYN) 500 MG tablet Take 1 tablet (500 mg total) by mouth 2 (two) times daily. 06/18/19   Raylene Everts, MD    Family History Family History  Problem Relation Age of Onset  . Healthy Mother   . Hypertension Father     Social History Social History   Tobacco Use  . Smoking status: Current Every Day Smoker    Packs/day: 0.25    Types: Cigarettes  . Smokeless tobacco: Never Used  Substance Use Topics  . Alcohol use: Yes    Comment: occasionally  . Drug use: Yes    Types: Marijuana     Allergies   Patient has no known allergies.   Review of Systems Review of Systems  Constitutional: Negative for chills and fever.  HENT: Negative for ear pain and sore throat.   Eyes: Negative for pain and visual disturbance.  Respiratory: Negative  for cough and shortness of breath.   Cardiovascular: Negative for chest pain and palpitations.  Gastrointestinal: Positive for abdominal pain and nausea. Negative for constipation, diarrhea and vomiting.  Genitourinary: Positive for menstrual problem. Negative for dysuria, frequency, hematuria and vaginal discharge.  Musculoskeletal: Negative for arthralgias, back pain and myalgias.  Skin: Negative for color change and rash.  Neurological: Negative for seizures and syncope.  All other systems reviewed and are negative.    Physical Exam Triage Vital Signs ED Triage Vitals [06/18/19 1552]  Enc Vitals Group     BP (!) 156/78     Pulse Rate 71     Resp 18     Temp 98.6 F (37 C)     Temp Source Oral     SpO2 100 %     Weight      Height      Head Circumference      Peak Flow      Pain Score 8     Pain Loc  Pain Edu?      Excl. in [place]?    No data found.  Updated Vital Signs BP (!) 156/78 (BP Location: Right Arm)   Pulse 71   Temp 98.6 F (37 C) (Oral)   Resp 18   SpO2 100%     Physical Exam Constitutional:      General: She is not in acute distress.    Appearance: She is well-developed and normal weight. She is not ill-appearing.  HENT:     Head: Normocephalic and atraumatic.  Eyes:     Conjunctiva/sclera: Conjunctivae normal.     Pupils: Pupils are equal, round, and reactive to light.  Neck:     Musculoskeletal: Normal range of motion.  Cardiovascular:     Rate and Rhythm: Normal rate and regular rhythm.     Heart sounds: Normal heart sounds.  Pulmonary:     Effort: Pulmonary effort is normal. No respiratory distress.     Breath sounds: Normal breath sounds.  Abdominal:     General: Abdomen is protuberant. Bowel sounds are normal. There is no distension.     Palpations: Abdomen is soft. There is no hepatomegaly or splenomegaly.     Tenderness: There is abdominal tenderness in the suprapubic area. There is no right CVA tenderness, left CVA tenderness,  guarding or rebound.     Comments: Very mild tenderness  Musculoskeletal: Normal range of motion.  Skin:    General: Skin is warm and dry.  Neurological:     Mental Status: She is alert.  Psychiatric:        Mood and Affect: Mood normal.        Behavior: Behavior normal.      UC Treatments / Results  Labs (all labs ordered are listed, but only abnormal results are displayed) Labs Reviewed  POC URINE PREG, ED  POCT URINALYSIS DIP (DEVICE)  POCT PREGNANCY, URINE  CERVICOVAGINAL ANCILLARY ONLY    EKG   Radiology No results found.  Procedures Procedures (including critical care time)  Medications Ordered in UC Medications - No data to display  Initial Impression / Assessment and Plan / UC Course  I have reviewed the triage vital signs and the nursing notes.  Pertinent labs & imaging results that were available during my care of the patient were reviewed by me and considered in my medical decision making (see chart for details).  Clinical Course as of Jun 17 1638  Mon Jun 18, 2019  1629 POC urine pregnancy [YN]    Clinical Course User Index [YN] Eustace Moore, MD    I explained to the patient that a pregnancy test and urinalysis are negative.  She has no significant abdominal tenderness guarding or rebound to make me suspect PID.  She is comfortable at this time.  I will do STD testing, treated with Naprosyn. Final Clinical Impressions(s) / UC Diagnoses   Final diagnoses:  Lower abdominal pain  Negative pregnancy test     Discharge Instructions     Safe sex is recommended We will call you if any of your tests are positive Take Naprosyn 2 times a day with food for abdominal cramping Return if you become worse instead of better at any time    ED Prescriptions    Medication Sig Dispense Auth. Provider   naproxen (NAPROSYN) 500 MG tablet Take 1 tablet (500 mg total) by mouth 2 (two) times daily. 30 tablet Eustace Moore, MD     PDMP not reviewed  this encounter.  Eustace MooreNelson, Nuri Branca Sue, MD 06/18/19 [phone]

## 2019-06-18 NOTE — Discharge Instructions (Addendum)
Safe sex is recommended We will call you if any of your tests are positive Take Naprosyn 2 times a day with food for abdominal cramping Return if you become worse instead of better at any time

## 2019-06-20 LAB — CERVICOVAGINAL ANCILLARY ONLY
Bacterial vaginitis: NEGATIVE
Chlamydia: NEGATIVE
Neisseria Gonorrhea: NEGATIVE
Trichomonas: NEGATIVE

## 2019-07-03 ENCOUNTER — Ambulatory Visit (HOSPITAL_COMMUNITY)
Admission: EM | Admit: 2019-07-03 | Discharge: 2019-07-03 | Disposition: A | Discharge status: Home / Self Care | Payer: Self-pay

## 2019-07-03 NOTE — ED Notes (Signed)
[place], emt  Reports this patient left after realizing covid results would not be immediately resulted.

## 2019-08-03 ENCOUNTER — Encounter (HOSPITAL_COMMUNITY): Payer: Self-pay

## 2019-08-03 ENCOUNTER — Ambulatory Visit (HOSPITAL_COMMUNITY)
Admission: EM | Admit: 2019-08-03 | Discharge: 2019-08-03 | Disposition: A | Discharge status: Home / Self Care | Payer: Self-pay | Attending: Internal Medicine | Admitting: Internal Medicine

## 2019-08-03 ENCOUNTER — Other Ambulatory Visit: Payer: Self-pay

## 2019-08-03 DIAGNOSIS — Z3202 Encounter for pregnancy test, result negative: Secondary | ICD-10-CM | POA: Insufficient documentation

## 2019-08-03 DIAGNOSIS — N898 Other specified noninflammatory disorders of vagina: Secondary | ICD-10-CM | POA: Insufficient documentation

## 2019-08-03 DIAGNOSIS — N912 Amenorrhea, unspecified: Secondary | ICD-10-CM | POA: Insufficient documentation

## 2019-08-03 LAB — POCT PREGNANCY, URINE: Preg Test, Ur: NEGATIVE

## 2019-08-03 LAB — POC URINE PREG, ED: Preg Test, Ur: NEGATIVE

## 2019-08-03 NOTE — Discharge Instructions (Signed)
Will notify you of any positive findings and if any changes to treatment are needed.  You may monitor your results on your MyChart online as well.   Please follow up with gynecology if symptoms persist or if your period continues to not return.

## 2019-08-03 NOTE — ED Triage Notes (Addendum)
Pt presents to UC w/ c/o increased vaginal discharge x1-2 weeks. Pt states she has not had a period in 2 months. Pt states she does not know if any sexual partners have an STD and she would like to be tested.

## 2019-08-03 NOTE — ED Provider Notes (Signed)
[facility]    CSN: [id number] Arrival date & time: 08/03/19  1149      History   Chief Complaint   HPI Sharon Gould is a 23 y.o. female.   Tyler presents with complaints of vaginal discomfort, as if all of her clothing feel tight, even if they are not. Some increased vaginal discharge, no odor, no itching. It is white and creamy. Denies sores, lesions, open skin, vaginal bleeding. LMP 9/1. States periods are typically regularly. No pain. No back pain. Noted symptoms two weeks ago. Took OTC azo which didn't help. She is not diabetic, hasn't been on antibiotics. Sexually active  With 1 partner without specific known exposure to STD. Doesn't use condoms. Would like std screening however. Screening negative last in October of this year.    ROS per HPI, negative if not otherwise mentioned.      History reviewed. No pertinent past medical history.  There are no active problems to display for this patient.   History reviewed. No pertinent surgical history.  OB History    Gravida  0   Para      Term      Preterm      AB      Living        SAB      TAB      Ectopic      Multiple      Live Births               Home Medications    Prior to Admission medications   Medication Sig Start Date End Date Taking? Authorizing Provider  naproxen (NAPROSYN) 500 MG tablet Take 1 tablet (500 mg total) by mouth 2 (two) times daily. 06/18/19   Raylene Everts, MD    Family History Family History  Problem Relation Age of Onset  . Healthy Mother   . Hypertension Father     Social History Social History   Tobacco Use  . Smoking status: Current Every Day Smoker    Packs/day: 0.25    Types: Cigarettes  . Smokeless tobacco: Never Used  Substance Use Topics  . Alcohol use: Yes    Comment: occasionally  . Drug use: Yes    Types: Marijuana     Allergies   Patient has no known allergies.   Review of Systems Review of Systems    Physical Exam Triage Vital Signs ED Triage Vitals  Enc Vitals Group     BP 08/03/19 1220 (!) 146/92     Pulse Rate 08/03/19 1220 70     Resp 08/03/19 1220 16     Temp 08/03/19 1220 98.6 F (37 C)     Temp Source 08/03/19 1220 Temporal     SpO2 08/03/19 1220 100 %     Weight --      Height --      Head Circumference --      Peak Flow --      Pain Score 08/03/19 1227 0     Pain Loc --      Pain Edu? --      Excl. in [place]? --    No data found.  Updated Vital Signs BP (!) 146/92 (BP Location: Left Arm)   Pulse 70   Temp 98.6 F (37 C) (Temporal)   Resp 16   LMP 05/15/2019   SpO2 100%    Physical Exam Constitutional:      General: She is not  in acute distress.    Appearance: She is well-developed.  Cardiovascular:     Rate and Rhythm: Normal rate.  Pulmonary:     Effort: Pulmonary effort is normal.  Abdominal:     Palpations: Abdomen is soft. Abdomen is not rigid.     Tenderness: There is no abdominal tenderness. There is no guarding or rebound.  Genitourinary:    Comments: Denies sores, lesions, vaginal bleeding; no pelvic pain; gu exam deferred at this time, vaginal self swab collected.   Skin:    General: Skin is warm and dry.  Neurological:     Mental Status: She is alert and oriented to person, place, and time.      UC Treatments / Results  Labs (all labs ordered are listed, but only abnormal results are displayed) Labs Reviewed  POCT PREGNANCY, URINE  POC URINE PREG, ED  CERVICOVAGINAL ANCILLARY ONLY    EKG   Radiology No results found.  Procedures Procedures (including critical care time)  Medications Ordered in UC Medications - No data to display  Initial Impression / Assessment and Plan / UC Course  I have reviewed the triage vital signs and the nursing notes.  Pertinent labs & imaging results that were available during my care of the patient were reviewed by me and considered in my medical decision making (see chart for details).      Negative urine pregnancy. Vaginal cytology collected and pending. Will notify of any positive findings and if any changes to treatment are needed.  Encouraged follow up with gyne as needed. Return precautions provided. Patient verbalized understanding and agreeable to plan.   Final Clinical Impressions(s) / UC Diagnoses   Final diagnoses:  Vaginal discharge  Amenorrhea  Negative pregnancy test     Discharge Instructions     Will notify you of any positive findings and if any changes to treatment are needed.  You may monitor your results on your MyChart online as well.   Please follow up with gynecology if symptoms persist or if your period continues to not return.    ED Prescriptions    None     PDMP not reviewed this encounter.   Georgetta Haber, NP 08/03/19 1415

## 2019-08-07 ENCOUNTER — Encounter (HOSPITAL_COMMUNITY): Payer: Self-pay

## 2019-08-07 ENCOUNTER — Telehealth (HOSPITAL_COMMUNITY): Payer: Self-pay | Admitting: Emergency Medicine

## 2019-08-07 LAB — CERVICOVAGINAL ANCILLARY ONLY
Bacterial vaginitis: POSITIVE — AB
Candida vaginitis: NEGATIVE
Chlamydia: NEGATIVE
Neisseria Gonorrhea: NEGATIVE
Trichomonas: NEGATIVE

## 2019-08-07 MED ORDER — METRONIDAZOLE 500 MG PO TABS: 500.0000 mg | ORAL_TABLET | Freq: Two times a day (BID) | ORAL | 0 refills | Status: AC

## 2019-08-07 NOTE — Telephone Encounter (Signed)
Bacterial vaginosis is positive. This was not treated at the urgent care visit.  Flagyl 500 mg BID x 7 days #14 no refills sent to patients pharmacy of choice.    Attempted to reach patient. No answer at this time. Mother answered and said shes not available. Informed mother I would send the patient a mychart message and to have her call me back if she has any questions about it.

## 2019-12-09 ENCOUNTER — Ambulatory Visit (INDEPENDENT_AMBULATORY_CARE_PROVIDER_SITE_OTHER): Payer: Self-pay

## 2019-12-09 ENCOUNTER — Ambulatory Visit (HOSPITAL_COMMUNITY)
Admission: EM | Admit: 2019-12-09 | Discharge: 2019-12-09 | Disposition: A | Discharge status: Home / Self Care | Payer: Self-pay | Attending: Family Medicine | Admitting: Family Medicine

## 2019-12-09 ENCOUNTER — Encounter (HOSPITAL_COMMUNITY): Payer: Self-pay | Admitting: Emergency Medicine

## 2019-12-09 ENCOUNTER — Other Ambulatory Visit: Payer: Self-pay

## 2019-12-09 DIAGNOSIS — M79641 Pain in right hand: Secondary | ICD-10-CM

## 2019-12-09 DIAGNOSIS — S60221A Contusion of right hand, initial encounter: Secondary | ICD-10-CM

## 2019-12-09 NOTE — Discharge Instructions (Addendum)
Ice and elevate to reduce pain and swelling Ibuprofen for pain Return as needed

## 2019-12-09 NOTE — ED Provider Notes (Signed)
[facility]    CSN: [id number] Arrival date & time: 12/09/19  1322      History   Chief Complaint Chief Complaint  Patient presents with  . Hand Pain    HPI Sharon Gould is a 24 y.o. female.   HPI  Patient dropped a box on her right hand yesterday at work She has swelling and pain around the long finger knuckle She has pain with movement of the long finger She has used ice and elevation  She is here for x-rays  History reviewed. No pertinent past medical history.  There are no problems to display for this patient.   History reviewed. No pertinent surgical history.  OB History    Gravida  0   Para      Term      Preterm      AB      Living        SAB      TAB      Ectopic      Multiple      Live Births               Home Medications    Prior to Admission medications   Medication Sig Start Date End Date Taking? Authorizing Provider  naproxen (NAPROSYN) 500 MG tablet Take 1 tablet (500 mg total) by mouth 2 (two) times daily. 06/18/19   Eustace Moore, MD    Family History Family History  Problem Relation Age of Onset  . Healthy Mother   . Hypertension Father     Social History Social History   Tobacco Use  . Smoking status: Current Every Day Smoker    Packs/day: 0.25    Types: Cigarettes  . Smokeless tobacco: Never Used  Substance Use Topics  . Alcohol use: Yes    Comment: occasionally  . Drug use: Yes    Types: Marijuana     Allergies   Patient has no known allergies.   Review of Systems Review of Systems  Musculoskeletal: Positive for arthralgias.     Physical Exam Triage Vital Signs ED Triage Vitals [12/09/19 1338]  Enc Vitals Group     BP 139/85     Pulse 88     Resp 18     Temp 98.5 F (36.9 C)     Temp Source Oral     SpO2 95 %     Weight      Height      Head Circumference      Peak Flow      Pain Score 7     Pain Loc      Pain Edu?      Excl. in GC?    No data found.  Updated  Vital Signs BP 139/85 (BP Location: Right Arm)   Temp 98.5 F (36.9 C) (Oral)   Resp 18   LMP 12/08/2019 (Exact Date)   SpO2 95%     Physical Exam Constitutional:      General: She is not in acute distress.    Appearance: Normal appearance. She is well-developed.  HENT:     Head: Normocephalic and atraumatic.     Mouth/Throat:     Comments: Mask in place Eyes:     Conjunctiva/sclera: Conjunctivae normal.     Pupils: Pupils are equal, round, and reactive to light.  Cardiovascular:     Rate and Rhythm: Normal rate.  Pulmonary:     Effort: Pulmonary effort is normal.  No respiratory distress.  Musculoskeletal:        General: Normal range of motion.       Hands:     Cervical back: Normal range of motion.  Skin:    General: Skin is warm and dry.  Neurological:     Mental Status: She is alert.      UC Treatments / Results  Labs (all labs ordered are listed, but only abnormal results are displayed) Labs Reviewed - No data to display  EKG   Radiology DG Hand Complete Right  Result Date: 12/09/2019 CLINICAL DATA:  Pain and swelling of the right hand due to injury last night. EXAM: RIGHT HAND - COMPLETE 3+ VIEW COMPARISON:  None FINDINGS: There is no evidence of fracture or dislocation. There is no evidence of arthropathy or other focal bone abnormality. There is slight soft tissue swelling over the dorsum of the hand at the level of the metacarpal phalangeal joints. IMPRESSION: No acute bone abnormality. Soft tissue swelling. Electronically Signed   By: Lorriane Shire M.D.   On: 12/09/2019 14:16    Procedures Procedures (including critical care time)  Medications Ordered in UC Medications - No data to display  Initial Impression / Assessment and Plan / UC Course  I have reviewed the triage vital signs and the nursing notes.  Pertinent labs & imaging results that were available during my care of the patient were reviewed by me and considered in my medical decision  making (see chart for details).     Contusion discussed Patient is shown her films Return as needed Final Clinical Impressions(s) / UC Diagnoses   Final diagnoses:  Contusion of right hand, initial encounter     Discharge Instructions     Ice and elevate to reduce pain and swelling Ibuprofen for pain Return as needed    ED Prescriptions    None     PDMP not reviewed this encounter.   Raylene Everts, MD 12/09/19 [phone]

## 2019-12-09 NOTE — ED Triage Notes (Signed)
Pt here for right middle finger and knuckle pain after dropping a box yesterday at work

## 2021-01-04 ENCOUNTER — Encounter (HOSPITAL_COMMUNITY): Payer: Self-pay

## 2021-01-04 ENCOUNTER — Ambulatory Visit (HOSPITAL_COMMUNITY)
Admission: EM | Admit: 2021-01-04 | Discharge: 2021-01-04 | Disposition: A | Discharge status: Home / Self Care | Payer: Self-pay | Attending: Family Medicine | Admitting: Family Medicine

## 2021-01-04 ENCOUNTER — Other Ambulatory Visit: Payer: Self-pay

## 2021-01-04 DIAGNOSIS — Z3202 Encounter for pregnancy test, result negative: Secondary | ICD-10-CM

## 2021-01-04 DIAGNOSIS — R109 Unspecified abdominal pain: Secondary | ICD-10-CM

## 2021-01-04 DIAGNOSIS — N760 Acute vaginitis: Secondary | ICD-10-CM | POA: Insufficient documentation

## 2021-01-04 LAB — POCT URINALYSIS DIPSTICK, ED / UC
Bilirubin Urine: NEGATIVE
Glucose, UA: NEGATIVE mg/dL
Hgb urine dipstick: NEGATIVE
Ketones, ur: NEGATIVE mg/dL
Leukocytes,Ua: NEGATIVE
Nitrite: NEGATIVE
Protein, ur: NEGATIVE mg/dL
Specific Gravity, Urine: 1.025 (ref 1.005–1.030)
Urobilinogen, UA: 0.2 mg/dL (ref 0.0–1.0)
pH: 5.5 (ref 5.0–8.0)

## 2021-01-04 LAB — POC URINE PREG, ED: Preg Test, Ur: NEGATIVE

## 2021-01-04 NOTE — ED Triage Notes (Signed)
Pt in with c/o lower abdominal cramping that has been going on for 1 month, pt requesting std testing.  Pt also c/o vaginal discharge

## 2021-01-04 NOTE — ED Provider Notes (Signed)
[facility]    CSN: [id number] Arrival date & time: 01/04/21  1107      History   Chief Complaint Chief Complaint  Patient presents with  . Abdominal Pain    HPI Sharon Gould is a 25 y.o. female.   Patient here today with intermittent abdominal cramping bilaterally, white thick discharge.  She denies dysuria, hematuria, rashes or lesions, fever, chills, nausea, vomiting, known exposures to STDs.  She would like to be screened for STDs today.  Periods are irregular, does not recall the exact date of her last period.  Sexually active with 1 partner.     History reviewed. No pertinent past medical history.  There are no problems to display for this patient.   History reviewed. No pertinent surgical history.  OB History    Gravida  0   Para      Term      Preterm      AB      Living        SAB      IAB      Ectopic      Multiple      Live Births               Home Medications    Prior to Admission medications   Medication Sig Start Date End Date Taking? Authorizing Provider  naproxen (NAPROSYN) 500 MG tablet Take 1 tablet (500 mg total) by mouth 2 (two) times daily. 06/18/19   Eustace Moore, MD    Family History Family History  Problem Relation Age of Onset  . Healthy Mother   . Hypertension Father     Social History Social History   Tobacco Use  . Smoking status: Current Every Day Smoker    Packs/day: 0.25    Types: Cigarettes  . Smokeless tobacco: Never Used  Vaping Use  . Vaping Use: Never used  Substance Use Topics  . Alcohol use: Yes    Comment: occasionally  . Drug use: Yes    Types: Marijuana     Allergies   Patient has no known allergies.   Review of Systems Review of Systems Per HPI Physical Exam Triage Vital Signs ED Triage Vitals  Enc Vitals Group     BP 01/04/21 1136 (!) 147/88     Pulse Rate 01/04/21 1136 88     Resp 01/04/21 1136 18     Temp 01/04/21 1136 98.9 F (37.2 C)     Temp src  --      SpO2 01/04/21 1136 96 %     Weight --      Height --      Head Circumference --      Peak Flow --      Pain Score 01/04/21 1134 5     Pain Loc --      Pain Edu? --      Excl. in GC? --    No data found.  Updated Vital Signs BP (!) 147/88   Pulse 88   Temp 98.9 F (37.2 C)   Resp 18   SpO2 96%   Breastfeeding No   Visual Acuity Right Eye Distance:   Left Eye Distance:   Bilateral Distance:    Right Eye Near:   Left Eye Near:    Bilateral Near:     Physical Exam Vitals and nursing note reviewed.  Constitutional:      Appearance: Normal appearance. She is not ill-appearing.  HENT:     Head: Atraumatic.  Eyes:     Extraocular Movements: Extraocular movements intact.     Conjunctiva/sclera: Conjunctivae normal.  Cardiovascular:     Rate and Rhythm: Normal rate and regular rhythm.     Heart sounds: Normal heart sounds.  Pulmonary:     Effort: Pulmonary effort is normal.     Breath sounds: Normal breath sounds.  Abdominal:     General: Bowel sounds are normal. There is no distension.     Palpations: Abdomen is soft.     Tenderness: There is no abdominal tenderness. There is no right CVA tenderness, left CVA tenderness or guarding.  Musculoskeletal:        General: Normal range of motion.     Cervical back: Normal range of motion and neck supple.  Skin:    General: Skin is warm and dry.  Neurological:     Mental Status: She is alert and oriented to person, place, and time.  Psychiatric:        Mood and Affect: Mood normal.        Thought Content: Thought content normal.        Judgment: Judgment normal.     UC Treatments / Results  Labs (all labs ordered are listed, but only abnormal results are displayed) Labs Reviewed  POCT URINALYSIS DIPSTICK, ED / UC  POC URINE PREG, ED  CERVICOVAGINAL ANCILLARY ONLY    EKG   Radiology No results found.  Procedures Procedures (including critical care time)  Medications Ordered in UC Medications -  No data to display  Initial Impression / Assessment and Plan / UC Course  I have reviewed the triage vital signs and the nursing notes.  Pertinent labs & imaging results that were available during my care of the patient were reviewed by me and considered in my medical decision making (see chart for details).     Exam reassuring, UA and urine pregnancy benign.  Vaginal swab pending.  Discussed supportive over-the-counter measures, vaginal hygiene, safe sexual practices.  Abstain from sexual contact until results return.  We will treat based on the vaginal swab results.  Follow-up with gynecology if having persisting issues.  Final Clinical Impressions(s) / UC Diagnoses   Final diagnoses:  Acute vaginitis   Discharge Instructions   None    ED Prescriptions    None     PDMP not reviewed this encounter.   Particia Nearing, [place] 01/04/21 1305

## 2021-01-05 LAB — CERVICOVAGINAL ANCILLARY ONLY
Bacterial Vaginitis (gardnerella): POSITIVE — AB
Candida Glabrata: NEGATIVE
Candida Vaginitis: POSITIVE — AB
Chlamydia: NEGATIVE
Comment: NEGATIVE
Comment: NEGATIVE
Comment: NEGATIVE
Comment: NEGATIVE
Comment: NEGATIVE
Comment: NORMAL
Neisseria Gonorrhea: NEGATIVE
Trichomonas: NEGATIVE

## 2021-01-07 ENCOUNTER — Telehealth (HOSPITAL_COMMUNITY): Payer: Self-pay | Admitting: Emergency Medicine

## 2021-01-07 MED ORDER — METRONIDAZOLE 500 MG PO TABS
500.0000 mg | ORAL_TABLET | Freq: Two times a day (BID) | ORAL | 0 refills | Status: DC
Start: 2021-01-07 — End: 2021-04-08

## 2021-01-07 MED ORDER — FLUCONAZOLE 150 MG PO TABS: 150.0000 mg | ORAL_TABLET | Freq: Once | ORAL | 0 refills | Status: AC

## 2021-04-07 ENCOUNTER — Encounter (HOSPITAL_COMMUNITY): Payer: Self-pay | Admitting: Registered Nurse

## 2021-04-07 ENCOUNTER — Ambulatory Visit (HOSPITAL_COMMUNITY)
Admission: EM | Admit: 2021-04-07 | Discharge: 2021-04-08 | Disposition: A | Discharge status: Home / Self Care | Payer: No Payment, Other | Attending: Registered Nurse | Admitting: Registered Nurse

## 2021-04-07 ENCOUNTER — Other Ambulatory Visit: Payer: Self-pay

## 2021-04-07 DIAGNOSIS — F17210 Nicotine dependence, cigarettes, uncomplicated: Secondary | ICD-10-CM | POA: Insufficient documentation

## 2021-04-07 DIAGNOSIS — F332 Major depressive disorder, recurrent severe without psychotic features: Secondary | ICD-10-CM | POA: Insufficient documentation

## 2021-04-07 DIAGNOSIS — Z20822 Contact with and (suspected) exposure to covid-19: Secondary | ICD-10-CM | POA: Insufficient documentation

## 2021-04-07 DIAGNOSIS — F1290 Cannabis use, unspecified, uncomplicated: Secondary | ICD-10-CM | POA: Insufficient documentation

## 2021-04-07 DIAGNOSIS — R45851 Suicidal ideations: Secondary | ICD-10-CM | POA: Insufficient documentation

## 2021-04-07 DIAGNOSIS — Z9151 Personal history of suicidal behavior: Secondary | ICD-10-CM | POA: Insufficient documentation

## 2021-04-07 DIAGNOSIS — F1320 Sedative, hypnotic or anxiolytic dependence, uncomplicated: Secondary | ICD-10-CM | POA: Insufficient documentation

## 2021-04-07 DIAGNOSIS — Z79899 Other long term (current) drug therapy: Secondary | ICD-10-CM | POA: Insufficient documentation

## 2021-04-07 LAB — URINALYSIS, ROUTINE W REFLEX MICROSCOPIC
Bilirubin Urine: NEGATIVE
Glucose, UA: NEGATIVE mg/dL
Hgb urine dipstick: NEGATIVE
Ketones, ur: 80 mg/dL — AB
Leukocytes,Ua: NEGATIVE
Nitrite: NEGATIVE
Protein, ur: NEGATIVE mg/dL
Specific Gravity, Urine: 1.024 (ref 1.005–1.030)
pH: 6 (ref 5.0–8.0)

## 2021-04-07 LAB — CBC WITH DIFFERENTIAL/PLATELET
Abs Immature Granulocytes: 0.06 10*3/uL (ref 0.00–0.07)
Basophils Absolute: 0 10*3/uL (ref 0.0–0.1)
Basophils Relative: 0 %
Eosinophils Absolute: 0 10*3/uL (ref 0.0–0.5)
Eosinophils Relative: 0 %
HCT: 48.3 % — ABNORMAL HIGH (ref 36.0–46.0)
Hemoglobin: 15.4 g/dL — ABNORMAL HIGH (ref 12.0–15.0)
Immature Granulocytes: 0 %
Lymphocytes Relative: 17 %
Lymphs Abs: 2.3 10*3/uL (ref 0.7–4.0)
MCH: 25.2 pg — ABNORMAL LOW (ref 26.0–34.0)
MCHC: 31.9 g/dL (ref 30.0–36.0)
MCV: 79.1 fL — ABNORMAL LOW (ref 80.0–100.0)
Monocytes Absolute: 0.6 10*3/uL (ref 0.1–1.0)
Monocytes Relative: 4 %
Neutro Abs: 10.5 10*3/uL — ABNORMAL HIGH (ref 1.7–7.7)
Neutrophils Relative %: 79 %
Platelets: 316 10*3/uL (ref 150–400)
RBC: 6.11 MIL/uL — ABNORMAL HIGH (ref 3.87–5.11)
RDW: 14.1 % (ref 11.5–15.5)
WBC: 13.5 10*3/uL — ABNORMAL HIGH (ref 4.0–10.5)
nRBC: 0 % (ref 0.0–0.2)

## 2021-04-07 LAB — ETHANOL: Alcohol, Ethyl (B): <10 mg/dL (ref <10)

## 2021-04-07 LAB — COMPREHENSIVE METABOLIC PANEL
ALT: 29 U/L (ref 0–44)
AST: 24 U/L (ref 15–41)
Albumin: 3.9 g/dL (ref 3.5–5.0)
Alkaline Phosphatase: 66 U/L (ref 38–126)
Anion gap: 9 (ref 5–15)
BUN: 9 mg/dL (ref 6–20)
CO2: 23 mmol/L (ref 22–32)
Calcium: 9.1 mg/dL (ref 8.9–10.3)
Chloride: 104 mmol/L (ref 98–111)
Creatinine, Ser: 0.81 mg/dL (ref 0.44–1.00)
GFR, Estimated: >60 mL/min (ref >60)
Glucose, Bld: 82 mg/dL (ref 70–99)
Potassium: 3.6 mmol/L (ref 3.5–5.1)
Sodium: 136 mmol/L (ref 135–145)
Total Bilirubin: 0.7 mg/dL (ref 0.3–1.2)
Total Protein: 6.5 g/dL (ref 6.5–8.1)

## 2021-04-07 LAB — POC SARS CORONAVIRUS 2 AG: SARSCOV2ONAVIRUS 2 AG: NEGATIVE

## 2021-04-07 LAB — POCT URINE DRUG SCREEN - MANUAL ENTRY (I-SCREEN)
POC Amphetamine UR: NOT DETECTED
POC Buprenorphine (BUP): NOT DETECTED
POC Cocaine UR: NOT DETECTED
POC Marijuana UR: POSITIVE — AB
POC Methadone UR: NOT DETECTED
POC Methamphetamine UR: NOT DETECTED
POC Morphine: NOT DETECTED
POC Oxazepam (BZO): NOT DETECTED
POC Oxycodone UR: NOT DETECTED
POC Secobarbital (BAR): NOT DETECTED

## 2021-04-07 LAB — LIPID PANEL
Cholesterol: 159 mg/dL (ref 0–200)
HDL: 49 mg/dL (ref >40)
LDL Cholesterol: 94 mg/dL (ref 0–99)
Total CHOL/HDL Ratio: 3.2 RATIO
Triglycerides: 82 mg/dL (ref <150)
VLDL: 16 mg/dL (ref 0–40)

## 2021-04-07 LAB — RESP PANEL BY RT-PCR (FLU A&B, COVID) ARPGX2
Influenza A by PCR: NEGATIVE
Influenza B by PCR: NEGATIVE
SARS Coronavirus 2 by RT PCR: NEGATIVE

## 2021-04-07 LAB — POCT PREGNANCY, URINE: Preg Test, Ur: NEGATIVE

## 2021-04-07 LAB — TSH: TSH: 1.667 u[IU]/mL (ref 0.350–4.500)

## 2021-04-07 LAB — MAGNESIUM: Magnesium: 1.9 mg/dL (ref 1.7–2.4)

## 2021-04-07 LAB — PREGNANCY, URINE: Preg Test, Ur: NEGATIVE

## 2021-04-07 MED ORDER — HYDROXYZINE HCL 25 MG PO TABS: 25.0000 mg | ORAL_TABLET | Freq: Three times a day (TID) | ORAL | Status: DC | PRN

## 2021-04-07 MED ORDER — CHLORDIAZEPOXIDE HCL 25 MG PO CAPS
25.0000 mg | ORAL_CAPSULE | Freq: Four times a day (QID) | ORAL | Status: DC
  Administered 2021-04-07 – 2021-04-08 (×2): 25 mg via ORAL
  Filled 2021-04-07 (×2): qty 1

## 2021-04-07 MED ORDER — ONDANSETRON 4 MG PO TBDP: 4.0000 mg | ORAL_TABLET | Freq: Four times a day (QID) | ORAL | Status: DC | PRN

## 2021-04-07 MED ORDER — CHLORDIAZEPOXIDE HCL 25 MG PO CAPS
25.0000 mg | ORAL_CAPSULE | Freq: Four times a day (QID) | ORAL | Status: DC | PRN
  Administered 2021-04-07: 25 mg via ORAL
  Filled 2021-04-07: qty 1

## 2021-04-07 MED ORDER — CHLORDIAZEPOXIDE HCL 25 MG PO CAPS
25.0000 mg | ORAL_CAPSULE | Freq: Three times a day (TID) | ORAL | Status: DC
Start: 2021-04-09 — End: 2021-04-08

## 2021-04-07 MED ORDER — CHLORDIAZEPOXIDE HCL 25 MG PO CAPS
25.0000 mg | ORAL_CAPSULE | ORAL | Status: DC
Start: 2021-04-10 — End: 2021-04-08

## 2021-04-07 MED ORDER — ACETAMINOPHEN 325 MG PO TABS: 650.0000 mg | ORAL_TABLET | Freq: Four times a day (QID) | ORAL | Status: DC | PRN

## 2021-04-07 MED ORDER — LOPERAMIDE HCL 2 MG PO CAPS: 2.0000 mg | ORAL_CAPSULE | ORAL | Status: DC | PRN

## 2021-04-07 MED ORDER — THIAMINE HCL 100 MG PO TABS
100.0000 mg | ORAL_TABLET | Freq: Every day | ORAL | Status: DC
Start: 2021-04-08 — End: 2021-04-08
  Administered 2021-04-08: 100 mg via ORAL
  Filled 2021-04-07: qty 1

## 2021-04-07 MED ORDER — HYDROXYZINE HCL 25 MG PO TABS
25.0000 mg | ORAL_TABLET | Freq: Four times a day (QID) | ORAL | Status: DC | PRN
  Administered 2021-04-07: 25 mg via ORAL
  Filled 2021-04-07: qty 1

## 2021-04-07 MED ORDER — TRAZODONE HCL 50 MG PO TABS: 50.0000 mg | ORAL_TABLET | Freq: Every evening | ORAL | Status: DC | PRN

## 2021-04-07 MED ORDER — QUETIAPINE FUMARATE ER 50 MG PO TB24
50.0000 mg | ORAL_TABLET | Freq: Every day | ORAL | Status: DC
  Administered 2021-04-07: 50 mg via ORAL
  Filled 2021-04-07: qty 1

## 2021-04-07 MED ORDER — ADULT MULTIVITAMIN W/MINERALS CH
1.0000 | ORAL_TABLET | Freq: Every day | ORAL | Status: DC
  Administered 2021-04-07 – 2021-04-08 (×2): 1 via ORAL
  Filled 2021-04-07 (×2): qty 1

## 2021-04-07 MED ORDER — MAGNESIUM HYDROXIDE 400 MG/5ML PO SUSP: 30.0000 mL | Freq: Every day | ORAL | Status: DC | PRN

## 2021-04-07 MED ORDER — ALUM & MAG HYDROXIDE-SIMETH 200-200-20 MG/5ML PO SUSP: 30.0000 mL | ORAL | Status: DC | PRN

## 2021-04-07 MED ORDER — CHLORDIAZEPOXIDE HCL 25 MG PO CAPS: 25.0000 mg | ORAL_CAPSULE | Freq: Every day | ORAL | Status: DC

## 2021-04-07 NOTE — ED Notes (Signed)
Patient cooperative with admission process.  Denied SI, HI, AVH at this time.  Ambulated independently to unit.  Oriented to unit and offered food/fluids.

## 2021-04-07 NOTE — ED Provider Notes (Signed)
Behavioral Health Admission [facility])  Date: 04/07/21 Patient Name: Sharon Gould MRN: [id number] Chief Complaint:  Chief Complaint  Patient presents with   Urgent Emergent Eval      Diagnoses:  Final diagnoses:  Severe benzodiazepine use disorder (HCC)  MDD (major depressive disorder), recurrent severe, without psychosis (HCC)  Suicidal ideation    HPI: Sharon Gould, 25 y.o., female patient presents to [facility] as walk in accompanied by her mother with complaints of suicidal ideation with plan and intent, xanax use disorder  Patient seen face to face by this provider, consulted with Dr. Earlene Plater; and chart reviewed on 04/07/21.  On evaluation Sharon Gould reports she has been having suicidal thoughts with plan to overdose on street drugs.  Patient states she has one prior suicide attempt "2019 I put a gun to my head and pulled the trigger; but it jammed.  I went to the ED and they only kept me overnight and discharged me the next day."  Patient asked if she told the provider she put a gun to her head and pulled the trigger "No I didn't tell them that, just that I was having suicidal thoughts."  Patient states she is taking "Xanax bar" 2 mg twice daily.  I want to stop but scared to.Patient states she has been taking Xanax off the street for 3 years and feel they are affecting her memory.  Patient also states that she drinks.  "I drink a forty about every other day."  Patient denies tremors, seizures when she doesn't drink alcohol.  Patient reports she lives with her mother who is supportive.  Discussed withdrawal from benzodiazapine and alcohol withdrawal with patient and treatment options and rehab services   During evaluation Sharon Gould is alert/oriented x 4; calm/cooperative; and her mood is depressed with flat affect.  She does not appear to be responding to internal/external stimuli or delusional thoughts.  Patient denies homicidal ideation, psychosis, and paranoia.  Patient  answered question appropriately.  Patient unable to contract for safety.  Recommended inpatient psychiatric treatment.  Patient stating she did not want to start medications; discussed options; (Seroquel for depression, sleep, and anxiety)    PHQ 2-9:   Flowsheet Row ED from 04/07/2021 in [facility] [place] [facility] from 01/04/2021 in [facility] RISK CATEGORY Moderate Risk Error: Question 6 not populated        Total Time spent with patient: 45 minutes  Musculoskeletal  Strength & Muscle Tone: within normal limits Gait & Station: normal Patient leans: N/A  Psychiatric Specialty Exam  Presentation General Appearance: Appropriate for Environment  Eye Contact:Good  Speech:Clear and Coherent; Normal Rate  Speech Volume:Normal  Handedness:Right   Mood and Affect  Mood:Depressed; Hopeless  Affect:Flat; Tearful; Depressed   Thought Process  Thought Processes:Coherent; Goal Directed  Descriptions of Associations:Intact  Orientation:Full (Time, Place and Person)  Thought Content:WDL  Diagnosis of Schizophrenia or Schizoaffective disorder in past: No   Hallucinations:No data recorded Ideas of Reference:None  Suicidal Thoughts:Suicidal Thoughts: Yes, Active SI Active Intent and/or Plan: With Intent; With Plan; With Means to Carry Out (Patient reporting she will overdose on street drugs)  Homicidal Thoughts:Homicidal Thoughts: No   Sensorium  Memory:Immediate Good; Recent Good; Remote Good  Judgment:Fair  Insight:Fair   Executive Functions  Concentration:Good  Attention Span:Good  Recall:Good  Fund of Knowledge:Good  Language:Good   Psychomotor Activity  Psychomotor Activity:Psychomotor Activity: Normal   Assets  Assets:Communication Skills; Desire for Improvement; Housing; Resilience; Social Support   Sleep  Sleep:Sleep: Good   Nutritional Assessment (For OBS and [facility] admissions only) Has the  patient had a weight loss or gain of 10 pounds or more in the last 3 months?: No Has the patient had a decrease in food intake/or appetite?: No Does the patient have dental problems?: No Does the patient have eating habits or behaviors that may be indicators of an eating disorder including binging or inducing vomiting?: No Has the patient recently lost weight without trying?: No Has the patient been eating poorly because of a decreased appetite?: No Malnutrition Screening Tool Score: 0    Physical Exam ROS  Blood pressure (!) 148/95, pulse 77, temperature 98.8 F (37.1 C), temperature source Oral, resp. rate 16, height 5\' 8"  (1.727 m), weight 246 lb (111.6 kg), SpO2 99 %. Body mass index is 37.4 kg/m.  Past Psychiatric History: Major depression, Benzo use disorder, suicidal ideation   Is the patient at risk to self? Yes  Has the patient been a risk to self in the past 6 months? No .    Has the patient been a risk to self within the distant past? Yes   Is the patient a risk to others? No   Has the patient been a risk to others in the past 6 months? No   Has the patient been a risk to others within the distant past? No   Past Medical History: History reviewed. No pertinent past medical history. History reviewed. No pertinent surgical history.  Family History:  Family History  Problem Relation Age of Onset   Healthy Mother    Hypertension Father     Social History:  Social History   Socioeconomic History   Marital status: Single    Spouse name: Not on file   Number of children: Not on file   Years of education: Not on file   Highest education level: Not on file  Occupational History   Not on file  Tobacco Use   Smoking status: Every Day    Packs/day: 0.25    Types: Cigarettes   Smokeless tobacco: Never  Vaping Use   Vaping Use: Never used  Substance and Sexual Activity   Alcohol use: Yes    Comment: occasionally   Drug use: Yes    Types: Marijuana   Sexual  activity: Yes    Birth control/protection: None  Other Topics Concern   Not on file  Social History Narrative   Not on file   Social Determinants of Health   Financial Resource Strain: Not on file  Food Insecurity: Not on file  Transportation Needs: Not on file  Physical Activity: Not on file  Stress: Not on file  Social Connections: Not on file  Intimate Partner Violence: Not on file    SDOH:  SDOH Screenings   Alcohol Screen: Not on file  Depression (PHQ2-9): Not on file  Financial Resource Strain: Not on file  Food Insecurity: Not on file  Housing: Not on file  Physical Activity: Not on file  Social Connections: Not on file  Stress: Not on file  Tobacco Use: High Risk   Smoking Tobacco Use: Every Day   Smokeless Tobacco Use: Never  Transportation Needs: Not on file    Last Labs:  Admission on 01/04/2021, Discharged on 01/04/2021  Component Date Value Ref Range Status   Glucose, UA 01/04/2021 NEGATIVE  NEGATIVE mg/dL Final   Bilirubin Urine 01/04/2021 NEGATIVE  NEGATIVE  Final   Ketones, ur 01/04/2021 NEGATIVE  NEGATIVE mg/dL Final   Specific Gravity, Urine 01/04/2021 1.025  1.005 - 1.030 Final   Hgb urine dipstick 01/04/2021 NEGATIVE  NEGATIVE Final   pH 01/04/2021 5.5  5.0 - 8.0 Final   Protein, ur 01/04/2021 NEGATIVE  NEGATIVE mg/dL Final   Urobilinogen, UA 01/04/2021 0.2  0.0 - 1.0 mg/dL Final   Nitrite 61/44/3154 NEGATIVE  NEGATIVE Final   Leukocytes,Ua 01/04/2021 NEGATIVE  NEGATIVE Final   Biochemical Testing Only. Please order routine urinalysis from main lab if confirmatory testing is needed.   Preg Test, Ur 01/04/2021 NEGATIVE  NEGATIVE Final   Comment:        THE SENSITIVITY OF THIS METHODOLOGY IS >24 mIU/mL    Neisseria Gonorrhea 01/04/2021 Negative   Final   Chlamydia 01/04/2021 Negative   Final   Trichomonas 01/04/2021 Negative   Final   Bacterial Vaginitis (gardnerella) 01/04/2021 Positive (A)  Final   Candida Vaginitis 01/04/2021 Positive (A)   Final   Candida Glabrata 01/04/2021 Negative   Final   Comment 01/04/2021 Normal Reference Range Candida Species - Negative   Final   Comment 01/04/2021 Normal Reference Range Candida Galbrata - Negative   Final   Comment 01/04/2021 Normal Reference Range Trichomonas - Negative   Final   Comment 01/04/2021 Normal Reference Ranger Chlamydia - Negative   Final   Comment 01/04/2021 Normal Reference Range Neisseria Gonorrhea - Negative   Final   Comment 01/04/2021 Normal Reference Range Bacterial Vaginosis - Negative   Final    Allergies: Patient has no known allergies.  PTA Medications: (Not in a hospital admission)   Medical Decision Making  Patient admitted to continuous assessment unit for safety and stabilization while seeking available bed for psychiatric hospitalization.    Lab Orders  Resp Panel by RT-PCR (Flu A&B, Covid) Nasopharyngeal Swab  CBC with Differential/Platelet  Comprehensive metabolic panel  Hemoglobin A1c  Magnesium  Ethanol  Lipid panel  TSH  Urinalysis, Routine w reflex microscopic Urine, Clean Catch  Pregnancy, urine  POC SARS Coronavirus 2 Ag-ED - Nasal Swab  POCT Urine Drug Screen - ([facility])    Medication Management:  Started Seroquel 50 mg Q hs  chlordiazePOXIDE  25 mg Oral QID   Followed by   Melene Muller ON 04/09/2021] chlordiazePOXIDE  25 mg Oral TID   Followed by   Melene Muller ON 04/10/2021] chlordiazePOXIDE  25 mg Oral BH-qamhs   Followed by   Melene Muller ON 04/11/2021] chlordiazePOXIDE  25 mg Oral Daily   multivitamin with minerals  1 tablet Oral Daily   QUEtiapine  50 mg Oral QHS   [START ON 04/08/2021] thiamine  100 mg Oral Daily       Recommendations  Based on my evaluation the patient does not appear to have an emergency medical condition. Inpatient psychiatric treatment  Request for bed sent via secure message to [facility]; if no beds available fax out.      Joss Friedel, NP 04/07/21  5:54 PM

## 2021-04-07 NOTE — ED Notes (Signed)
Patient arrived on unit.
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
## 2021-04-07 NOTE — ED Notes (Signed)
Pt A&O x 4, resting at present, sad & tearful, Monitoring for safety.  No acute distress noted.

## 2021-04-07 NOTE — Discharge Instructions (Addendum)
Patient is instructed prior to discharge to:  Take all medications as prescribed by his/her mental healthcare provider. Report any adverse effects and or reactions from the medicines to his/her outpatient provider promptly. Keep all scheduled appointments, to ensure that you are getting refills on time and to avoid any interruption in your medication.  If you are unable to keep an appointment call to reschedule.  Be sure to follow-up with resources and follow-up appointments provided.  Patient has been instructed & cautioned: To not engage in alcohol and or illegal drug use while on prescription medicines. In the event of worsening symptoms, patient is instructed to call the crisis hotline, 911 and or go to the nearest ED for appropriate evaluation and treatment of symptoms.   Substance Abuse Treatment Resources listed Below:  [facility] - Admissions are currently completed Monday through Friday at 8am; both appointments and walk-ins are accepted.  Any individual that is a [facility] resident may present for a substance abuse screening and assessment for admission.  A person may be referred by numerous sources or self-refer.   Potential clients will be screened for medical necessity and appropriateness for the program.  Clients must meet criteria for high-intensity residential treatment services.  If clinically appropriate, a client will continue with the comprehensive clinical assessment and intake process, as well as enrollment in the [facility].  Address: [address] [place], [place] [address] Admin Hours: Bettye Boeck to [facility] Hours: 24/7 Phone: [phone] Fax: [phone]  [facility] - [facility] Address: [address] [place], [place], [place] [address] Behavioral Health Urgent [facility]) Hours: 24/7 Phone: [phone] Fax: [phone]  [facility] ([facility]): (offers outpatient therapy and intensive outpatient substance  abuse therapy).  [address], [place], [place] [address] Phone: [phone]  [facility]: Offers FREE recovery skills classes, support groups, 1:1 Peer Support, and Compeer Classes. [address], [place], [place] [address] Phone: [phone] (Call to complete intake).   [facility] [address] [place], [place] [address] Phone: [phone] ext [phone] The [facility] provides food, shelter and other programs and services to the homeless men of [place] through our [employer] program.  By offering safe shelter, three meals a day, clean clothing, Biblical counseling, financial planning, vocational training, GED/education and employment assistance, we've helped mend the shattered lives of many homeless men since opening in 1974.  We have approximately 267 beds available, with a max of 312 beds including mats for emergency situations and currently house an average of 270 men a night.  Prospective Client Check-In Information Photo ID Required (State/ Out of State/ [facility]) - if photo ID is not available, clients are required to have a printout of a police/sheriff's criminal history report. Help out with chores around the [facility]. No sex offender of any type (pending, charged, registered and/or any other sex related offenses) will be permitted to check in. Must be willing to abide by all rules, regulations, and policies established by the [employer]. The following will be provided - shelter, food, clothing, and biblical counseling. If you or someone you know is in need of assistance at our [facility] shelter in [place], [place], please call [phone] ext. [phone].  [facility]-will provide timely access to mental health services for children and adolescents (4-17) and adults presenting in a mental health crisis. The program is designed for those who need urgent Behavioral Health or Substance Use  treatment and are not experiencing a medical crisis that would typically require an emergency room visit.    [address] [place], [place] [address] Phone: [phone] [email or web address]  [facility]: Phone#: [phone]  The [facility] SA Intensive Outpatient Program (SAIOP) means structured individual and group addiction activities and services that are provided at an outpatient program designed to assist adult and adolescent consumers to begin recovery and learn skills for recovery maintenance. The [facility] SAIOP program is offered at least 3 hours a day, 3 days a week.SAIOP services shall include a structured program consisting of, but not limited to, the following services: Individual counseling and support; Group counseling and support; Family counseling, training or support; Biochemical assays to identify recent drug use (e.g., urine drug screens); Strategies for relapse prevention to include community and social support systems in treatment; Life skills; Crisis contingency planning; Disease Management; and Treatment support activities that have been adapted or specifically designed for persons with physical disabilities, or persons with co-occurring disorders of mental illness and substance abuse/dependence or mental retardation/developmental disability and substance abuse/dependence. Phone: [phone]  Address:   The [facility] will also offer the following outpatient services: (Monday through Friday 8am-5pm)   Partial Hospitalization Program (PHP) Substance Abuse Intensive Outpatient Program (SA-IOP) Group Therapy Medication Management Peer Living Room We also provide (24/7):  Assessments: Our mental health clinician and providers will conduct a focused mental health evaluation, assessing for immediate safety concerns and further mental health needs. Referral: Our team will provide resources and help connect to community  based mental health treatment, when indicated, including psychotherapy, psychiatry, and other specialized behavioral health or substance use disorder services (for those not already in treatment). Transitional Care: Our team providers in person bridging and/or telephonic follow-up during the patient's transition to outpatient services.  The [facility] 24-Hour Call Center: [phone] Behavioral Health Crisis Line: [phone]  To follow-up with his/her primary care provider for your other medical issues, concerns and or health care needs.

## 2021-04-08 LAB — HEMOGLOBIN A1C
Hgb A1c MFr Bld: 5.8 % — ABNORMAL HIGH (ref 4.8–5.6)
Mean Plasma Glucose: 120 mg/dL

## 2021-04-08 MED ORDER — TRAZODONE HCL 50 MG PO TABS: 50.0000 mg | ORAL_TABLET | Freq: Every evening | ORAL | 0 refills | Status: DC | PRN

## 2021-04-08 MED ORDER — ADULT MULTIVITAMIN W/MINERALS CH: 1.0000 | ORAL_TABLET | Freq: Every day | ORAL | 0 refills | Status: DC

## 2021-04-08 MED ORDER — THIAMINE HCL 100 MG PO TABS: 100.0000 mg | ORAL_TABLET | Freq: Every day | ORAL | 0 refills | Status: DC

## 2021-04-08 MED ORDER — QUETIAPINE FUMARATE ER 50 MG PO TB24: 50.0000 mg | ORAL_TABLET | Freq: Every day | ORAL | 0 refills | Status: DC

## 2021-04-08 MED ORDER — CHLORDIAZEPOXIDE HCL 25 MG PO CAPS: 25.0000 mg | ORAL_CAPSULE | ORAL | 0 refills | Status: AC

## 2021-04-08 NOTE — Progress Notes (Signed)
Patient information has been sent to [facility] via secure chat to review for potential admission. Patient meets inpatient criteria per Assunta Found, NP.   Situation ongoing, CSW will continue to monitor progress.    Signed:  Damita Dunnings, MSW, LCSW-A  04/08/2021 8:34 AM

## 2021-04-08 NOTE — ED Notes (Signed)
Patient discharge with community resources for follow up and paper prescriptions. Patient denies SI, HI and AVH at time of discharge with pain 0/10. Patient discharged with mother in private vehicle.

## 2021-04-08 NOTE — ED Provider Notes (Addendum)
FBC/OBS ASAP Discharge Summary  Date and Time: 04/08/2021 11:13 AM  Name: Sharon Gould  MRN:  [id number]   Discharge Diagnoses:  Final diagnoses:  Severe benzodiazepine use disorder (HCC)  MDD (major depressive disorder), recurrent severe, without psychosis (HCC)  Suicidal ideation    Subjective: Patient states "I feel better, I really just want to go home, I want to go home to my mom's house." Sharon declines transition to substance use treatment at this time.  She reports plan to follow-up with outpatient substance use treatment options. Patient is insightful regarding situation.  She found out several days ago that her best friend has engaged in a romantic relationship with her Elyanna's boyfriend, subsequently she has bonded relationship with both her best friend and boyfriend.  Patient is re assessed face-to-face by nurse practitioner. She is seated in observation area, no acute distress.  She is alert and oriented, pleasant and cooperative during assessment. She reports depressed mood with congruent affect, tearful at times.  She denies suicidal and homicidal ideations.  She denies any history of suicide attempts, denies history of self-harm.  She contracts verbally for safety with this Clinical research associatewriter. She has normal speech and behavior. She denies both auditory and visual hallucinations.  Patient is able to converse coherently with goal-directed thoughts and no distractibility or preoccupation.  He denies paranoia.  Objectively there is no evidence of psychosis/mania or delusional thinking.  Patient offered support and encouragement.  She gives verbal consent to speak with her mother, Larose KellsLamonica McKain phone number [phone].  Spoke with patient's mother who denies concern for patient safety.  Discussed treatment plan and medication with patient's mother.  [facility]   Stay Summary:  HPI 04/07/2021 at 1754: Sharon Gould,  25 y.o., female [facility] in accompanied by her mother with complaints of suicidal ideation with plan and intent, xanax use disorder   Patient seen face to face by this provider, consulted with Dr. Earlene PlaterKatherine Laubach; and chart reviewed on 04/07/21.  On evaluation Sharon Gould reports she has been having suicidal thoughts with plan to overdose on street drugs.  Patient states she has one prior suicide attempt "2019 I put a gun to my head and pulled the trigger; but it jammed.  I went to the ED and they only kept me overnight and discharged me the next day."  Patient asked if she told the provider she put a gun to her head and pulled the trigger "No I didn't tell them that, just that I was having suicidal thoughts."  Patient states she is taking "Xanax bar" 2 mg twice daily.  I want to stop but scared to.Patient states she has been taking Xanax off the street for 3 years and feel they are affecting her memory.  Patient also states that she drinks.  "I drink a forty about every other day."  Patient denies tremors, seizures when she doesn't drink alcohol.  Patient reports she lives with her mother who is supportive.  Discussed withdrawal from benzodiazapine and alcohol withdrawal with patient and treatment options and rehab services   During evaluation Sharon Gould is alert/oriented x 4; calm/cooperative; and her mood is depressed with flat affect.  She does not appear to be responding to internal/external stimuli or delusional thoughts.  Patient denies homicidal ideation, psychosis, and paranoia.  Patient answered question appropriately.  Patient unable to contract for safety.  Recommended inpatient  psychiatric treatment.  Patient stating she did not want to start medications; discussed options; (Seroquel for depression, sleep, and anxiety)      Total Time spent with patient: 30 minutes  Past Psychiatric History: benzodiazepine use disorder, MDD Past Medical History: History  reviewed. No pertinent past medical history. History reviewed. No pertinent surgical history. Family History:  Family History  Problem Relation Age of Onset   Healthy Mother    Hypertension Father    Family Psychiatric History: none reported Social History:  Social History   Substance and Sexual Activity  Alcohol Use Yes   Comment: occasionally     Social History   Substance and Sexual Activity  Drug Use Yes   Types: Marijuana    Social History   Socioeconomic History   Marital status: Single    Spouse name: Not on file   Number of children: Not on file   Years of education: Not on file   Highest education level: Not on file  Occupational History   Not on file  Tobacco Use   Smoking status: Every Day    Packs/day: 0.25    Types: Cigarettes   Smokeless tobacco: Never  Vaping Use   Vaping Use: Never used  Substance and Sexual Activity   Alcohol use: Yes    Comment: occasionally   Drug use: Yes    Types: Marijuana   Sexual activity: Yes    Birth control/protection: None  Other Topics Concern   Not on file  Social History Narrative   Not on file   Social Determinants of Health   Financial Resource Strain: Not on file  Food Insecurity: Not on file  Transportation Needs: Not on file  Physical Activity: Not on file  Stress: Not on file  Social Connections: Not on file   SDOH:  SDOH Screenings   Alcohol Screen: Not on file  Depression (PHQ2-9): Not on file  Financial Resource Strain: Not on file  Food Insecurity: Not on file  Housing: Not on file  Physical Activity: Not on file  Social Connections: Not on file  Stress: Not on file  Tobacco Use: High Risk   Smoking Tobacco Use: Every Day   Smokeless Tobacco Use: Never  Transportation Needs: Not on file    Tobacco Cessation:  N/A, patient does not currently use tobacco products  Current Medications:  Current Facility-Administered Medications  Medication Dose Route Frequency Provider Last Rate Last  Admin   acetaminophen (TYLENOL) tablet 650 mg  650 mg Oral Q6H PRN Rankin, Shuvon B, NP       alum & mag hydroxide-simeth (MAALOX/MYLANTA) 200-200-20 MG/5ML suspension 30 mL  30 mL Oral Q4H PRN Rankin, Shuvon B, NP       chlordiazePOXIDE (LIBRIUM) capsule 25 mg  25 mg Oral Q6H PRN Rankin, Shuvon B, NP   25 mg at 04/07/21 2039   chlordiazePOXIDE (LIBRIUM) capsule 25 mg  25 mg Oral QID Rankin, Shuvon B, NP   25 mg at 04/08/21 3810   Followed by   Melene Muller ON 04/09/2021] chlordiazePOXIDE (LIBRIUM) capsule 25 mg  25 mg Oral TID Rankin, Shuvon B, NP       Followed by   Melene Muller ON 04/10/2021] chlordiazePOXIDE (LIBRIUM) capsule 25 mg  25 mg Oral BH-qamhs Rankin, Shuvon B, NP       Followed by   Melene Muller ON 04/11/2021] chlordiazePOXIDE (LIBRIUM) capsule 25 mg  25 mg Oral Daily Rankin, Shuvon B, NP       hydrOXYzine (ATARAX/VISTARIL) tablet 25 mg  25 mg Oral Q6H PRN Rankin, Shuvon B, NP   25 mg at 04/07/21 2039   loperamide (IMODIUM) capsule 2-4 mg  2-4 mg Oral PRN Rankin, Shuvon B, NP       magnesium hydroxide (MILK OF MAGNESIA) suspension 30 mL  30 mL Oral Daily PRN Rankin, Shuvon B, NP       multivitamin with minerals tablet 1 tablet  1 tablet Oral Daily Rankin, Shuvon B, NP   1 tablet at 04/08/21 0948   ondansetron (ZOFRAN-ODT) disintegrating tablet 4 mg  4 mg Oral Q6H PRN Rankin, Shuvon B, NP       QUEtiapine (SEROQUEL XR) 24 hr tablet 50 mg  50 mg Oral QHS Rankin, Shuvon B, NP   50 mg at 04/07/21 2039   thiamine tablet 100 mg  100 mg Oral Daily Rankin, Shuvon B, NP   100 mg at 04/08/21 0948   traZODone (DESYREL) tablet 50 mg  50 mg Oral QHS PRN Rankin, Shuvon B, NP       No current outpatient medications on file.    PTA Medications: (Not in a hospital admission)   Musculoskeletal  Strength & Muscle Tone: within normal limits Gait & Station: normal Patient leans: N/A  Psychiatric Specialty Exam  Presentation  General Appearance: Appropriate for Environment; Casual  Eye  Contact:Good  Speech:Clear and Coherent  Speech Volume:Normal  Handedness:Right   Mood and Affect  Mood:Depressed  Affect:Congruent; Depressed   Thought Process  Thought Processes:Coherent; Goal Directed  Descriptions of Associations:Intact  Orientation:Full (Time, Place and Person)  Thought Content:Logical; WDL  Diagnosis of Schizophrenia or Schizoaffective disorder in past: No    Hallucinations:Hallucinations: None  Ideas of Reference:None  Suicidal Thoughts:Suicidal Thoughts: No SI Active Intent and/or Plan: With Intent; With Plan; With Means to Carry Out (Patient reporting she will overdose on street drugs)  Homicidal Thoughts:Homicidal Thoughts: No   Sensorium  Memory:Immediate Good; Recent Good; Remote Good  Judgment:Fair  Insight:Fair   Executive Functions  Concentration:Good  Attention Span:Good  Recall:Good  Fund of Knowledge:Good  Language:Good   Psychomotor Activity  Psychomotor Activity:Psychomotor Activity: Normal   Assets  Assets:Communication Skills; Desire for Improvement; Financial Resources/Insurance; Housing; Intimacy; Leisure Time; Physical Health; Resilience; Social Support   Sleep  Sleep:Sleep: Good   Nutritional Assessment (For OBS and FBC admissions only) Has the patient had a weight loss or gain of 10 pounds or more in the last 3 months?: No Has the patient had a decrease in food intake/or appetite?: No Does the patient have dental problems?: No Does the patient have eating habits or behaviors that may be indicators of an eating disorder including binging or inducing vomiting?: No Has the patient recently lost weight without trying?: No Has the patient been eating poorly because of a decreased appetite?: No Malnutrition Screening Tool Score: 0    Physical Exam  Physical Exam Vitals and nursing note reviewed.  Constitutional:      Appearance: Normal appearance. She is well-developed. She is obese.  HENT:      Head: Normocephalic.     Nose: Nose normal.  Cardiovascular:     Rate and Rhythm: Normal rate.  Pulmonary:     Effort: Pulmonary effort is normal.  Musculoskeletal:        General: Normal range of motion.     Cervical back: Normal range of motion.  Skin:    General: Skin is warm and dry.  Neurological:     Mental Status: She is alert and oriented to person,  place, and time.  Psychiatric:        Attention and Perception: Attention and perception normal.        Mood and Affect: Affect normal. Mood is depressed.        Speech: Speech normal.        Behavior: Behavior normal. Behavior is cooperative.        Thought Content: Thought content normal.        Cognition and Memory: Cognition and memory normal.        Judgment: Judgment normal.   Review of Systems  Constitutional: Negative.   HENT: Negative.    Eyes: Negative.   Respiratory: Negative.    Cardiovascular: Negative.   Gastrointestinal: Negative.   Genitourinary: Negative.   Musculoskeletal: Negative.   Skin: Negative.   Neurological: Negative.   Endo/Heme/Allergies: Negative.   Psychiatric/Behavioral:  Positive for depression and substance abuse.   Blood pressure (!) 163/99, pulse 70, temperature 98.4 F (36.9 C), temperature source Oral, resp. rate 16, height 5\' 8"  (1.727 m), weight 246 lb (111.6 kg), SpO2 98 %. Body mass index is 37.4 kg/m.  Demographic Factors:  NA  Loss Factors: Loss of significant relationship  Historical Factors: Prior suicide attempts  Risk Reduction Factors:   Responsible for children under 23 years of age, Sense of responsibility to family, Living with another person, especially a relative, Positive social support, Positive therapeutic relationship, and Positive coping skills or problem solving skills  Continued Clinical Symptoms:  Alcohol/Substance Abuse/Dependencies  Cognitive Features That Contribute To Risk:  None    Suicide Risk:  Minimal: No identifiable suicidal ideation.   Patients presenting with no risk factors but with morbid ruminations; may be classified as minimal risk based on the severity of the depressive symptoms  Plan Of Care/Follow-up recommendations:  Other:  Patient reviewed with Dr 15. Continue current medications including: -Chlordiazepoxide 25 mg taper as directed for 8 doses(taper complete on 04/11/2021) -Multivitamin 1 tab daily -Quetiapine XR 50 mg nightly/mood -Thiamine 100 mg daily -Trazodone 50 mg nightly as needed/sleep Follow-up with outpatient psychiatry at [facility]. Follow-up with substance use treatment options provided.  Disposition: Discharge   [facility], FNP 04/08/2021, 11:13 AM

## 2021-04-09 ENCOUNTER — Telehealth: Payer: Self-pay | Admitting: Physician Assistant

## 2021-04-09 DIAGNOSIS — B9689 Other specified bacterial agents as the cause of diseases classified elsewhere: Secondary | ICD-10-CM

## 2021-04-09 DIAGNOSIS — T3695XA Adverse effect of unspecified systemic antibiotic, initial encounter: Secondary | ICD-10-CM

## 2021-04-09 DIAGNOSIS — N760 Acute vaginitis: Secondary | ICD-10-CM

## 2021-04-09 DIAGNOSIS — B379 Candidiasis, unspecified: Secondary | ICD-10-CM

## 2021-04-09 MED ORDER — FLUCONAZOLE 150 MG PO TABS: 150.0000 mg | ORAL_TABLET | Freq: Once | ORAL | 0 refills | Status: AC

## 2021-04-09 MED ORDER — METRONIDAZOLE 500 MG PO TABS: 500.0000 mg | ORAL_TABLET | Freq: Two times a day (BID) | ORAL | 0 refills | Status: DC

## 2021-04-09 NOTE — Progress Notes (Signed)
E-Visit for Vaginal Symptoms  We are sorry that you are not feeling well. Here is how we plan to help! Based on what you shared with me it looks like you: May have a vaginosis due to bacteria  Vaginosis is an inflammation of the vagina that can result in discharge, itching and pain. The cause is usually a change in the normal balance of vaginal bacteria or an infection. Vaginosis can also result from reduced estrogen levels after menopause.  The most common causes of vaginosis are:   Bacterial vaginosis which results from an overgrowth of one on several organisms that are normally present in your vagina.   Yeast infections which are caused by a naturally occurring fungus called candida.   Vaginal atrophy (atrophic vaginosis) which results from the thinning of the vagina from reduced estrogen levels after menopause.   Trichomoniasis which is caused by a parasite and is commonly transmitted by sexual intercourse.  Factors that increase your risk of developing vaginosis include: Medications, such as antibiotics and steroids Uncontrolled diabetes Use of hygiene products such as bubble bath, vaginal spray or vaginal deodorant Douching Wearing damp or tight-fitting clothing Using an intrauterine device (IUD) for birth control Hormonal changes, such as those associated with pregnancy, birth control pills or menopause Sexual activity Having a sexually transmitted infection   Your treatment plan is Metronidazole or Flagyl 500mg  twice a day for 7 days.  I have electronically sent this prescription into the pharmacy that you have chosen. I have also sent in a script for Diflucan for antibiotic-associated yeast.   Be sure to take all of the medication as directed. Stop taking any medication if you develop a rash, tongue swelling or shortness of breath. Mothers who are breast feeding should consider pumping and discarding their breast milk while on these antibiotics. However, there is no consensus  that infant exposure at these doses would be harmful.  Remember that medication creams can weaken latex condoms.   HOME CARE:  Good hygiene may prevent some types of vaginosis from recurring and may relieve some symptoms:  Avoid baths, hot tubs and whirlpool spas. Rinse soap from your outer genital area after a shower, and dry the area well to prevent irritation. Don't use scented or harsh soaps, such as those with deodorant or antibacterial action. Avoid irritants. These include scented tampons and pads. Wipe from front to back after using the toilet. Doing so avoids spreading fecal bacteria to your vagina.  Other things that may help prevent vaginosis include:  Don't douche. Your vagina doesn't require cleansing other than normal bathing. Repetitive douching disrupts the normal organisms that reside in the vagina and can actually increase your risk of vaginal infection. Douching won't clear up a vaginal infection. Use a latex condom. Both female and female latex condoms may help you avoid infections spread by sexual contact. Wear cotton underwear. Also wear pantyhose with a cotton crotch. If you feel comfortable without it, skip wearing underwear to bed. Yeast thrives in [place] Your symptoms should improve in the next day or two.  GET HELP RIGHT AWAY IF:  You have pain in your lower abdomen ( pelvic area or over your ovaries) You develop nausea or vomiting You develop a fever Your discharge changes or worsens You have persistent pain with intercourse You develop shortness of breath, a rapid pulse, or you faint.  These symptoms could be signs of problems or infections that need to be evaluated by a medical provider now.  MAKE SURE  YOU   Understand these instructions. Will watch your condition. Will get help right away if you are not doing well or get worse.  Thank you for choosing an e-visit.  Your e-visit answers were reviewed by a board certified advanced  clinical practitioner to complete your personal care plan. Depending upon the condition, your plan could have included both over the counter or prescription medications.  Please review your pharmacy choice. Make sure the pharmacy is open so you can pick up prescription now. If there is a problem, you may contact your provider through [employer] and have the prescription routed to another pharmacy.  Your safety is important to [place]. If you have drug allergies check your prescription carefully.   For the next 24 hours you can use MyChart to ask questions about today's visit, request a non-urgent call back, or ask for a work or school excuse. You will get an email in the next two days asking about your experience. I hope that your e-visit has been valuable and will speed your recovery.

## 2021-04-09 NOTE — Progress Notes (Signed)
I have spent 5 minutes in review of e-visit questionnaire, review and updating patient chart, medical decision making and response to patient.   Djimon Lundstrom Cody Carynn Felling, PA-C    

## 2021-06-28 ENCOUNTER — Telehealth: Payer: Self-pay | Admitting: Nurse Practitioner

## 2021-06-28 DIAGNOSIS — N760 Acute vaginitis: Secondary | ICD-10-CM

## 2021-06-28 DIAGNOSIS — B9689 Other specified bacterial agents as the cause of diseases classified elsewhere: Secondary | ICD-10-CM

## 2021-06-28 DIAGNOSIS — T3695XA Adverse effect of unspecified systemic antibiotic, initial encounter: Secondary | ICD-10-CM

## 2021-06-28 DIAGNOSIS — B379 Candidiasis, unspecified: Secondary | ICD-10-CM

## 2021-06-28 MED ORDER — FLUCONAZOLE 150 MG PO TABS: 150.0000 mg | ORAL_TABLET | Freq: Once | ORAL | 0 refills | Status: AC

## 2021-06-28 MED ORDER — METRONIDAZOLE 500 MG PO TABS: 500.0000 mg | ORAL_TABLET | Freq: Two times a day (BID) | ORAL | 0 refills | Status: DC

## 2021-06-28 NOTE — Progress Notes (Signed)
I have spent 5 minutes in review of e-visit questionnaire, review and updating patient chart, medical decision making and response to patient.  ° °Abbagale Goguen W Cadi Rhinehart, NP ° °  °

## 2021-06-28 NOTE — Progress Notes (Signed)

## 2021-10-04 ENCOUNTER — Telehealth: Payer: Self-pay | Admitting: Nurse Practitioner

## 2021-10-04 DIAGNOSIS — T3695XA Adverse effect of unspecified systemic antibiotic, initial encounter: Secondary | ICD-10-CM

## 2021-10-04 DIAGNOSIS — B379 Candidiasis, unspecified: Secondary | ICD-10-CM

## 2021-10-04 DIAGNOSIS — N760 Acute vaginitis: Secondary | ICD-10-CM

## 2021-10-04 DIAGNOSIS — B9689 Other specified bacterial agents as the cause of diseases classified elsewhere: Secondary | ICD-10-CM

## 2021-10-04 MED ORDER — METRONIDAZOLE 500 MG PO TABS: 500.0000 mg | ORAL_TABLET | Freq: Two times a day (BID) | ORAL | 0 refills | Status: DC

## 2021-10-04 MED ORDER — FLUCONAZOLE 150 MG PO TABS: 150.0000 mg | ORAL_TABLET | Freq: Once | ORAL | 0 refills | Status: AC

## 2021-10-04 NOTE — Progress Notes (Signed)

## 2021-10-04 NOTE — Progress Notes (Signed)
I have spent 5 minutes in review of e-visit questionnaire, review and updating patient chart, medical decision making and response to patient.  ° °Rozann Holts W Annalyssa Thune, NP ° °  °

## 2022-08-25 ENCOUNTER — Telehealth: Payer: Self-pay | Admitting: Emergency Medicine

## 2022-08-25 DIAGNOSIS — N898 Other specified noninflammatory disorders of vagina: Secondary | ICD-10-CM

## 2022-08-25 MED ORDER — METRONIDAZOLE 500 MG PO TABS: 500.0000 mg | ORAL_TABLET | Freq: Two times a day (BID) | ORAL | 0 refills | Status: DC

## 2022-08-25 NOTE — Progress Notes (Signed)
E-Visit for Vaginal Symptoms  We are sorry that you are not feeling well. Here is how we plan to help! Based on what you shared with me it looks like you: May have a vaginosis due to bacteria  Vaginosis is an inflammation of the vagina that can result in discharge, itching and pain. The cause is usually a change in the normal balance of vaginal bacteria or an infection. Vaginosis can also result from reduced estrogen levels after menopause.  The most common causes of vaginosis are:   Bacterial vaginosis which results from an overgrowth of one on several organisms that are normally present in your vagina.   Yeast infections which are caused by a naturally occurring fungus called candida.   Vaginal atrophy (atrophic vaginosis) which results from the thinning of the vagina from reduced estrogen levels after menopause.   Trichomoniasis which is caused by a parasite and is commonly transmitted by sexual intercourse.  Factors that increase your risk of developing vaginosis include: Medications, such as antibiotics and steroids Uncontrolled diabetes Use of hygiene products such as bubble bath, vaginal spray or vaginal deodorant Douching Wearing damp or tight-fitting clothing Using an intrauterine device (IUD) for birth control Hormonal changes, such as those associated with pregnancy, birth control pills or menopause Sexual activity Having a sexually transmitted infection  Your treatment plan is Metronidazole or Flagyl 500mg twice a day for 7 days.  I have electronically sent this prescription into the pharmacy that you have chosen.  Be sure to take all of the medication as directed. Stop taking any medication if you develop a rash, tongue swelling or shortness of breath. Mothers who are breast feeding should consider pumping and discarding their breast milk while on these antibiotics. However, there is no consensus that infant exposure at these doses would be harmful.  Remember that  medication creams can weaken latex condoms. .   HOME CARE:  Good hygiene may prevent some types of vaginosis from recurring and may relieve some symptoms:  Avoid baths, hot tubs and whirlpool spas. Rinse soap from your outer genital area after a shower, and dry the area well to prevent irritation. Don't use scented or harsh soaps, such as those with deodorant or antibacterial action. Avoid irritants. These include scented tampons and pads. Wipe from front to back after using the toilet. Doing so avoids spreading fecal bacteria to your vagina.  Other things that may help prevent vaginosis include:  Don't douche. Your vagina doesn't require cleansing other than normal bathing. Repetitive douching disrupts the normal organisms that reside in the vagina and can actually increase your risk of vaginal infection. Douching won't clear up a vaginal infection. Use a latex condom. Both female and female latex condoms may help you avoid infections spread by sexual contact. Wear cotton underwear. Also wear pantyhose with a cotton crotch. If you feel comfortable without it, skip wearing underwear to bed. Yeast thrives in moist environments Your symptoms should improve in the next day or two.  GET HELP RIGHT AWAY IF:  You have pain in your lower abdomen ( pelvic area or over your ovaries) You develop nausea or vomiting You develop a fever Your discharge changes or worsens You have persistent pain with intercourse You develop shortness of breath, a rapid pulse, or you faint.  These symptoms could be signs of problems or infections that need to be evaluated by a medical provider now.  MAKE SURE YOU   Understand these instructions. Will watch your condition. Will get help right   away if you are not doing well or get worse.  Thank you for choosing an e-visit.  Your e-visit answers were reviewed by a board certified advanced clinical practitioner to complete your personal care plan. Depending upon the  condition, your plan could have included both over the counter or prescription medications.  Please review your pharmacy choice. Make sure the pharmacy is open so you can pick up prescription now. If there is a problem, you may contact your provider through MyChart messaging and have the prescription routed to another pharmacy.  Your safety is important to us. If you have drug allergies check your prescription carefully.   For the next 24 hours you can use MyChart to ask questions about today's visit, request a non-urgent call back, or ask for a work or school excuse. You will get an email in the next two days asking about your experience. I hope that your e-visit has been valuable and will speed your recovery.  Approximately 5 minutes was used in reviewing the patient's chart, questionnaire, prescribing medications, and documentation.  

## 2023-05-24 ENCOUNTER — Ambulatory Visit
Admission: EM | Admit: 2023-05-24 | Discharge: 2023-05-24 | Disposition: A | Discharge status: Home / Self Care | Payer: Medicaid Other | Attending: Physician Assistant | Admitting: Physician Assistant

## 2023-05-24 DIAGNOSIS — Z6832 Body mass index (BMI) 32.0-32.9, adult: Secondary | ICD-10-CM | POA: Insufficient documentation

## 2023-05-24 DIAGNOSIS — M5440 Lumbago with sciatica, unspecified side: Secondary | ICD-10-CM | POA: Diagnosis not present

## 2023-05-24 DIAGNOSIS — F17200 Nicotine dependence, unspecified, uncomplicated: Secondary | ICD-10-CM | POA: Insufficient documentation

## 2023-05-24 DIAGNOSIS — E669 Obesity, unspecified: Secondary | ICD-10-CM | POA: Insufficient documentation

## 2023-05-24 LAB — POCT URINALYSIS DIP (MANUAL ENTRY)
Bilirubin, UA: NEGATIVE
Blood, UA: NEGATIVE
Glucose, UA: NEGATIVE mg/dL
Ketones, POC UA: NEGATIVE mg/dL
Leukocytes, UA: NEGATIVE
Nitrite, UA: NEGATIVE
Protein Ur, POC: NEGATIVE mg/dL
Spec Grav, UA: 1.015 (ref 1.010–1.025)
Urobilinogen, UA: 0.2 U/dL
pH, UA: 6 (ref 5.0–8.0)

## 2023-05-24 MED ORDER — PREDNISONE 20 MG PO TABS: 40.0000 mg | ORAL_TABLET | Freq: Every day | ORAL | 0 refills | Status: AC

## 2023-05-24 MED ORDER — PREDNISONE 10 MG PO TABS: 20.0000 mg | ORAL_TABLET | Freq: Every day | ORAL | 0 refills | Status: DC

## 2023-05-24 MED ORDER — TIZANIDINE HCL 4 MG PO TABS: 4.0000 mg | ORAL_TABLET | Freq: Four times a day (QID) | ORAL | 0 refills | Status: DC | PRN

## 2023-05-24 NOTE — ED Provider Notes (Signed)
[facility]    CSN: [id number] Arrival date & time: 05/24/23  6948      History   Chief Complaint Chief Complaint  Patient presents with   Back Pain    HPI Sharon Gould is a 27 y.o. female.   Patient here today for evaluation of right lower back pain that started about a month ago.  She reports that certain positions and movement worsen pain.  She notes that pain does radiate down her legs at times.  She denies any numbness.  She has not had any fever or urinary symptoms.  She notes that her stools are normal.  She did not have any injury prior to development of pain.  She has tried using heat without resolution of pain.  The history is provided by the patient.    History reviewed. No pertinent past medical history.  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 No pertinent surgical history.  OB History     Gravida  0   Para      Term      Preterm      AB      Living         SAB      IAB      Ectopic      Multiple      Live Births               Home Medications    Prior to Admission medications   Medication Sig Start Date End Date Taking? Authorizing Provider  predniSONE (DELTASONE) 20 MG tablet Take 2 tablets (40 mg total) by mouth daily with breakfast for 5 days. 05/24/23 05/29/23 Yes Tomi Bamberger, PA-C  tiZANidine (ZANAFLEX) 4 MG tablet Take 1 tablet (4 mg total) by mouth every 6 (six) hours as needed for muscle spasms. 05/24/23  Yes Tomi Bamberger, PA-C  metroNIDAZOLE (FLAGYL) 500 MG tablet Take 1 tablet (500 mg total) by mouth 2 (two) times daily. 08/25/22   Roxy Horseman, PA-C  Multiple Vitamin (MULTIVITAMIN WITH MINERALS) TABS  tablet Take 1 tablet by mouth daily. 04/09/21   Lenard Lance, FNP  QUEtiapine (SEROQUEL XR) 50 MG TB24 24 hr tablet Take 1 tablet (50 mg total) by mouth at bedtime. 04/08/21   Lenard Lance, FNP  thiamine 100 MG tablet Take 1 tablet (100 mg total) by mouth daily. 04/09/21   Lenard Lance, FNP  traZODone (DESYREL) 50 MG tablet Take 1 tablet (50 mg total) by mouth at bedtime as needed for sleep. 04/08/21   Lenard Lance, FNP    Family History Family History  Problem Relation Age of Onset   Healthy Mother    Hypertension Father     Social History Social History   Tobacco Use   Smoking status: Former    Current packs/day: 0.25    Types: Cigarettes   Smokeless tobacco: Never  Vaping Use   Vaping status: Never Used  Substance Use Topics   Alcohol use: Yes    Comment: occasionally   Drug use: Yes    Types: Marijuana     Allergies   Other   Review of Systems Review of Systems  Constitutional:  Negative for chills and fever.  Eyes:  Negative for discharge and redness.  Respiratory:  Negative for shortness of breath.   Gastrointestinal:  Negative for abdominal pain, nausea and vomiting.  Genitourinary:  Negative for dysuria.  Musculoskeletal:  Positive for back pain and myalgias.  Neurological:  Negative for numbness.     Physical Exam Triage Vital Signs ED Triage Vitals  Encounter Vitals Group     BP      Systolic BP Percentile      Diastolic BP Percentile      Pulse      Resp      Temp      Temp src      SpO2      Weight      Height      Head Circumference      Peak Flow      Pain Score      Pain Loc      Pain Education      Exclude from Growth Chart    No data found.  Updated Vital Signs BP (!) 144/82 (BP Location: Left Arm)   Pulse 92   Temp 99.1 F (37.3 C) (Oral)   Resp 18   Ht 5\' 8"  (1.727 m)   Wt 220 lb (99.8 kg)   LMP 04/28/2023 (Approximate)   SpO2 97%   BMI 33.45 kg/m     Physical Exam Vitals and nursing note reviewed.   Constitutional:      General: She is not in acute distress.    Appearance: Normal appearance. She is not ill-appearing.  HENT:     Head: Normocephalic and atraumatic.  Eyes:     Conjunctiva/sclera: Conjunctivae normal.  Cardiovascular:     Rate and Rhythm: Normal rate.  Pulmonary:     Effort: Pulmonary effort is normal. No respiratory distress.  Musculoskeletal:     Comments: No tenderness to palpation to midline spine.  No tenderness palpation across low back.  Neurological:     Mental Status: She is alert.  Psychiatric:        Mood and Affect: Mood normal.        Behavior: Behavior normal.        Thought Content: Thought content normal.      UC Treatments / Results  Labs (all labs ordered are listed, but only abnormal results are displayed) Labs Reviewed  POCT URINALYSIS DIP (MANUAL ENTRY)    EKG   Radiology No results found.  Procedures Procedures (including critical care time)  Medications Ordered in UC Medications - No data to display  Initial Impression / Assessment and Plan / UC Course  I have reviewed the triage vital signs and the nursing notes.  Pertinent labs & imaging results that were available during my care of the patient were reviewed by me and considered in my medical decision making (see chart for details).    Suspect likely muscular strain with sciatica.  Will treat with steroid burst and muscle relaxer.  Advised muscle relaxer may cause drowsiness and to use with caution.  Discussed that heat application and massage may also alleviate symptoms.  Recommended follow-up if no gradual improvement with any further concerns.  Final Clinical Impressions(s) / UC Diagnoses   Final diagnoses:  Acute right-sided low back pain with sciatica, sciatica laterality unspecified   Discharge Instructions   None    ED Prescriptions     Medication Sig Dispense Auth. Provider   tiZANidine (ZANAFLEX) 4 MG tablet Take 1  tablet (4 mg total) by mouth every 6  (six) hours as needed for muscle spasms. 30 tablet Erma Pinto F, PA-C   predniSONE (DELTASONE) 10 MG tablet  (Status: Discontinued) Take 2 tablets (20 mg total) by mouth daily. 15 tablet Erma Pinto F, PA-C   predniSONE (DELTASONE) 20 MG tablet Take 2 tablets (40 mg total) by mouth daily with breakfast for 5 days. 10 tablet Tomi Bamberger, PA-C      PDMP not reviewed this encounter.   Tomi Bamberger, PA-C 05/24/23 1003

## 2023-05-24 NOTE — ED Triage Notes (Signed)
"  My back hurts on my lower right side around my pant line". It does extend around to side sometimes. Not related to injury known. "Its been like for about a month". No fever. No dysuria. No urinary problems known. Stools "normal".

## 2023-06-25 ENCOUNTER — Encounter: Payer: Self-pay | Admitting: Family Medicine

## 2023-06-27 ENCOUNTER — Encounter: Payer: Medicaid Other | Admitting: Family Medicine

## 2023-06-27 ENCOUNTER — Telehealth: Payer: Self-pay | Admitting: Family Medicine

## 2023-06-27 NOTE — Telephone Encounter (Signed)
Spoke to patient about her appointment, and how we would not have an availability until December. She will schedule for a later time.

## 2023-08-24 ENCOUNTER — Telehealth: Payer: Medicaid Other | Admitting: Family Medicine

## 2023-08-24 DIAGNOSIS — N760 Acute vaginitis: Secondary | ICD-10-CM

## 2023-08-24 DIAGNOSIS — B9689 Other specified bacterial agents as the cause of diseases classified elsewhere: Secondary | ICD-10-CM | POA: Diagnosis not present

## 2023-08-24 MED ORDER — METRONIDAZOLE 500 MG PO TABS: 500.0000 mg | ORAL_TABLET | Freq: Two times a day (BID) | ORAL | 0 refills | Status: AC

## 2023-08-24 NOTE — Progress Notes (Signed)

## 2023-09-16 ENCOUNTER — Encounter: Payer: Self-pay | Admitting: Nurse Practitioner

## 2023-09-16 ENCOUNTER — Ambulatory Visit: Payer: Medicaid Other | Admitting: Nurse Practitioner

## 2023-09-16 ENCOUNTER — Other Ambulatory Visit: Payer: Self-pay | Admitting: Nurse Practitioner

## 2023-09-16 VITALS — BP 140/99 | HR 93 | Temp 97.3°F | Wt 282.4 lb

## 2023-09-16 DIAGNOSIS — R0602 Shortness of breath: Secondary | ICD-10-CM

## 2023-09-16 DIAGNOSIS — Z0000 Encounter for general adult medical examination without abnormal findings: Secondary | ICD-10-CM

## 2023-09-16 DIAGNOSIS — Z803 Family history of malignant neoplasm of breast: Secondary | ICD-10-CM | POA: Diagnosis not present

## 2023-09-16 DIAGNOSIS — Z113 Encounter for screening for infections with a predominantly sexual mode of transmission: Secondary | ICD-10-CM

## 2023-09-16 DIAGNOSIS — N6314 Unspecified lump in the right breast, lower inner quadrant: Secondary | ICD-10-CM

## 2023-09-16 DIAGNOSIS — Z13220 Encounter for screening for lipoid disorders: Secondary | ICD-10-CM | POA: Diagnosis not present

## 2023-09-16 DIAGNOSIS — Z1329 Encounter for screening for other suspected endocrine disorder: Secondary | ICD-10-CM

## 2023-09-16 DIAGNOSIS — N644 Mastodynia: Secondary | ICD-10-CM

## 2023-09-16 NOTE — Progress Notes (Signed)
 Subjective   Patient ID: Sharon Gould, female    DOB: 12/18/1995, 28 y.o.   MRN: [id number]  Chief Complaint  Patient presents with   Breast Pain    Right breast x three months    Exposure to STD    Pt wants to have a std check     Referring provider: No ref. provider found  Sharon Gould is a 28 y.o. female with No past medical history on file.   HPI  Std screen - discharge - recently was prescribed flagyl  through e visit - did not elp symptoms.dec11th  Mom had breast cancer - right breast pain    Allergies  Allergen Reactions   Other Nausea And Vomiting    Muscle relaxer's make the throw up.      There is no immunization history on file for this patient.  Tobacco History: Social History   Tobacco Use  Smoking Status Former   Current packs/day: 0.25   Types: Cigarettes  Smokeless Tobacco Never   Counseling given: Not Answered   Outpatient Encounter Medications as of 09/16/2023  Medication Sig   Multiple Vitamin (MULTIVITAMIN WITH MINERALS) TABS tablet Take 1 tablet by mouth daily.   QUEtiapine  (SEROQUEL  XR) 50 MG TB24 24 hr tablet Take 1 tablet (50 mg total) by mouth at bedtime. (Patient not taking: Reported on 09/16/2023)   thiamine  100 MG tablet Take 1 tablet (100 mg total) by mouth daily. (Patient not taking: Reported on 09/16/2023)   tiZANidine  (ZANAFLEX ) 4 MG tablet Take 1 tablet (4 mg total) by mouth every 6 (six) hours as needed for muscle spasms. (Patient not taking: Reported on 09/16/2023)   traZODone  (DESYREL ) 50 MG tablet Take 1 tablet (50 mg total) by mouth at bedtime as needed for sleep. (Patient not taking: Reported on 09/16/2023)   No facility-administered encounter medications on file as of 09/16/2023.    Review of Systems  Review of Systems   Objective:   BP (!) 140/99   Pulse 93   Temp (!) 97.3 F (36.3 C)   Wt 282 lb 6.4 oz (128.1 kg)   SpO2 95%   BMI 42.94 kg/m   Wt Readings from Last 5 Encounters:  09/16/23 282 lb 6.4 oz (128.1  kg)  05/24/23 220 lb (99.8 kg)  07/25/17 231 lb 8 oz (105 kg)  03/05/16 197 lb (89.4 kg) (97%, Z= 1.90)*  09/18/15 242 lb 3.2 oz (109.9 kg) (>99%, Z= 2.42)*   * Growth percentiles are based on CDC (Girls, 2-20 Years) data.     Physical Exam Vitals and nursing note reviewed. Exam conducted with a chaperone present.  Constitutional:      General: She is not in acute distress.    Appearance: She is well-developed.  Cardiovascular:     Rate and Rhythm: Normal rate and regular rhythm.  Pulmonary:     Effort: Pulmonary effort is normal.     Breath sounds: Normal breath sounds.  Chest:  Breasts:    Right: Tenderness present.     Left: Mass present.    Neurological:     Mental Status: She is alert and oriented to person, place, and time.       Assessment & Plan:   Breast pain, right -     Ambulatory referral to Obstetrics / Gynecology -     MM 3D DIAGNOSTIC MAMMOGRAM BILATERAL BREAST  Family history of breast cancer in first degree relative -     Ambulatory referral to Obstetrics /  Gynecology -     MM 3D DIAGNOSTIC MAMMOGRAM BILATERAL BREAST  Routine adult health maintenance -     CBC -     Comprehensive metabolic panel  Thyroid  disorder screen -     TSH  Lipid screening -     Lipid panel  Screen for STD (sexually transmitted disease) -     NuSwab Vaginitis Plus (VG+) -     Chlamydia/Gonococcus/Trichomonas, NAA -     RPR+HIV+GC+CT Panel  Shortness of breath -     Brain natriuretic peptide  Mass of lower inner quadrant of right breast     Return in about 3 months (around 12/15/2023).   Bascom GORMAN Borer, NP 09/16/2023

## 2023-09-16 NOTE — Patient Instructions (Signed)
 1. Breast pain, right (Primary)  - Ambulatory referral to Obstetrics / Gynecology - MM 3D DIAGNOSTIC MAMMOGRAM BILATERAL BREAST  2. Family history of breast cancer in first degree relative  - Ambulatory referral to Obstetrics / Gynecology - MM 3D DIAGNOSTIC MAMMOGRAM BILATERAL BREAST  3. Routine adult health maintenance  - CBC - Comprehensive metabolic panel  4. Thyroid  disorder screen  - TSH  5. Lipid screening  - Lipid Panel  6. Screen for STD (sexually transmitted disease)  - NuSwab Vaginitis Plus (VG+) - Chlamydia/Gonococcus/Trichomonas, NAA - RPR+HIV+GC+CT Panel  7. Shortness of breath  - Brain natriuretic peptide  8. Mass of lower inner quadrant of right breast  Follow up:  Follow up in 3 months

## 2023-09-18 LAB — CHLAMYDIA/GONOCOCCUS/TRICHOMONAS, NAA
Chlamydia by NAA: NEGATIVE
Gonococcus by NAA: NEGATIVE
Trich vag by NAA: NEGATIVE

## 2023-09-18 LAB — LIPID PANEL
Chol/HDL Ratio: 4.6 {ratio} — ABNORMAL HIGH (ref 0.0–4.4)
Cholesterol, Total: 169 mg/dL (ref 100–199)
HDL: 37 mg/dL — ABNORMAL LOW (ref >39)
LDL Chol Calc (NIH): 105 mg/dL — ABNORMAL HIGH (ref 0–99)
Triglycerides: 150 mg/dL — ABNORMAL HIGH (ref 0–149)
VLDL Cholesterol Cal: 27 mg/dL (ref 5–40)

## 2023-09-18 LAB — COMPREHENSIVE METABOLIC PANEL
ALT: 21 [IU]/L (ref 0–32)
AST: 19 [IU]/L (ref 0–40)
Albumin: 4.4 g/dL (ref 4.0–5.0)
Alkaline Phosphatase: 69 [IU]/L (ref 44–121)
BUN/Creatinine Ratio: 13 (ref 9–23)
BUN: 10 mg/dL (ref 6–20)
Bilirubin Total: 0.2 mg/dL (ref 0.0–1.2)
CO2: 16 mmol/L — ABNORMAL LOW (ref 20–29)
Calcium: 9.2 mg/dL (ref 8.7–10.2)
Chloride: 107 mmol/L — ABNORMAL HIGH (ref 96–106)
Creatinine, Ser: 0.78 mg/dL (ref 0.57–1.00)
Globulin, Total: 2.4 g/dL (ref 1.5–4.5)
Glucose: 96 mg/dL (ref 70–99)
Potassium: 4.4 mmol/L (ref 3.5–5.2)
Sodium: 140 mmol/L (ref 134–144)
Total Protein: 6.8 g/dL (ref 6.0–8.5)
eGFR: 107 mL/min/{1.73_m2} (ref >59)

## 2023-09-18 LAB — CBC
Hematocrit: 45.5 % (ref 34.0–46.6)
Hemoglobin: 14.6 g/dL (ref 11.1–15.9)
MCH: 25 pg — ABNORMAL LOW (ref 26.6–33.0)
MCHC: 32.1 g/dL (ref 31.5–35.7)
MCV: 78 fL — ABNORMAL LOW (ref 79–97)
Platelets: 324 10*3/uL (ref 150–450)
RBC: 5.83 x10E6/uL — ABNORMAL HIGH (ref 3.77–5.28)
RDW: 13.2 % (ref 11.7–15.4)
WBC: 11.5 10*3/uL — ABNORMAL HIGH (ref 3.4–10.8)

## 2023-09-18 LAB — TSH: TSH: 0.801 u[IU]/mL (ref 0.450–4.500)

## 2023-09-18 LAB — RPR+HIV+GC+CT PANEL
HIV Screen 4th Generation wRfx: NONREACTIVE
RPR Ser Ql: NONREACTIVE

## 2023-09-18 LAB — BRAIN NATRIURETIC PEPTIDE: BNP: <2.5 pg/mL (ref 0.0–100.0)

## 2023-09-19 LAB — NUSWAB VAGINITIS PLUS (VG+)
Candida albicans, NAA: NEGATIVE
Candida glabrata, NAA: NEGATIVE
Chlamydia trachomatis, NAA: NEGATIVE
Neisseria gonorrhoeae, NAA: NEGATIVE
Trich vag by NAA: NEGATIVE

## 2023-09-23 ENCOUNTER — Ambulatory Visit
Admission: RE | Admit: 2023-09-23 | Discharge: 2023-09-23 | Disposition: A | Discharge status: Home / Self Care | Payer: Medicaid Other | Source: Ambulatory Visit | Attending: Nurse Practitioner

## 2023-09-23 DIAGNOSIS — N644 Mastodynia: Secondary | ICD-10-CM

## 2023-09-23 DIAGNOSIS — Z803 Family history of malignant neoplasm of breast: Secondary | ICD-10-CM

## 2023-09-28 ENCOUNTER — Telehealth: Payer: Medicaid Other | Admitting: Physician Assistant

## 2023-09-28 DIAGNOSIS — R6889 Other general symptoms and signs: Secondary | ICD-10-CM | POA: Diagnosis not present

## 2023-09-28 MED ORDER — OSELTAMIVIR PHOSPHATE 75 MG PO CAPS: 75.0000 mg | ORAL_CAPSULE | Freq: Two times a day (BID) | ORAL | 0 refills | Status: AC

## 2023-09-28 NOTE — Progress Notes (Signed)
 I have spent 5 minutes in review of e-visit questionnaire, review and updating patient chart, medical decision making and response to patient.   Piedad Climes, PA-C

## 2023-09-28 NOTE — Progress Notes (Signed)
 E visit for Flu like symptoms   We are sorry that you are not feeling well.  Here is how we plan to help! Based on what you have shared with me it looks like you may have a respiratory virus that may be influenza.  Influenza or "the flu" is   an infection caused by a respiratory virus. The flu virus is highly contagious and persons who did not receive their yearly flu vaccination may "catch" the flu from close contact.  We have anti-viral medications to treat the viruses that cause this infection. They are not a "cure" and only shorten the course of the infection. These prescriptions are most effective when they are given within the first 2 days of "flu" symptoms. Antiviral medication are indicated if you have a high risk of complications from the flu. You should  also consider an antiviral medication if you are in close contact with someone who is at risk. These medications can help patients avoid complications from the flu  but have side effects that you should know. Possible side effects from Tamiflu or oseltamivir include nausea, vomiting, diarrhea, dizziness, headaches, eye redness, sleep problems or other respiratory symptoms. You should not take Tamiflu if you have an allergy to oseltamivir or any to the ingredients in Tamiflu.  Based upon your symptoms and potential risk factors I have prescribed Oseltamivir (Tamiflu).  It has been sent to your designated pharmacy.  You will take one 75 mg capsule orally twice a day for the next 5 days. and I recommend that you follow the flu symptoms recommendation that I have listed below.  Please keep well-hydrated and try to get plenty of rest. If you have a humidifier, place it in the bedroom and run it at night. Start a saline nasal rinse for nasal congestion. You can consider use of a nasal steroid spray like Flonase or Nasacort OTC. You can alternate between Tylenol and Ibuprofen if needed for fever, body aches, headache and/or throat pain. Salt  water-gargles and chloraseptic spray can be very beneficial for sore throat. Mucinex-DM for congestion or cough. Please take all prescribed medications as directed.  Remain out of work until [employer] for 24 hours without a fever-reducing medication, and you are feeling better.  You should mask until symptoms are resolved.  If anything worsens despite treatment, you need to be evaluated in-person. Please do not delay care.  ANYONE WHO HAS FLU SYMPTOMS SHOULD: Stay home. The flu is highly contagious and going out or to work exposes others! Be sure to drink plenty of fluids. Water is fine as well as fruit juices, sodas and electrolyte beverages. You may want to stay away from caffeine or alcohol. If you are nauseated, try taking small sips of liquids. How do you know if you are getting enough fluid? Your urine should be a pale yellow or almost colorless. Get rest. Taking a steamy shower or using a humidifier may help nasal congestion and ease sore throat pain. Using a saline nasal spray works much the same way. Cough drops, hard candies and sore throat lozenges may ease your cough. Line up a caregiver. Have someone check on you regularly.   GET HELP RIGHT AWAY IF: You cannot keep down liquids or your medications. You become short of breath Your fell like you are going to pass out or loose consciousness. Your symptoms persist after you have completed your treatment plan MAKE SURE YOU  Understand these instructions. Will watch your condition. Will get help right away  if you are not doing well or get worse.  Your e-visit answers were reviewed by a board certified advanced clinical practitioner to complete your personal care plan.  Depending on the condition, your plan could have included both over the counter or prescription medications.  If there is a problem please reply  once you have received a response from your provider.  Your safety is important to [place].  If you have drug allergies  check your prescription carefully.    You can use MyChart to ask questions about today's visit, request a non-urgent call back, or ask for a work or school excuse for 24 hours related to this e-Visit. If it has been greater than 24 hours you will need to follow up with your provider, or enter a new e-Visit to address those concerns.  You will get an e-mail in the next two days asking about your experience.  I hope that your e-visit has been valuable and will speed your recovery. Thank you for using e-visits.

## 2023-11-14 ENCOUNTER — Ambulatory Visit: Payer: Self-pay | Admitting: Nurse Practitioner

## 2023-11-16 ENCOUNTER — Telehealth: Payer: Self-pay | Admitting: Plastic Surgery

## 2023-11-16 NOTE — Telephone Encounter (Signed)
 called 11-16-23 lvmail to ask what she needs to be seen for tomorrow 11-17-23 w Dr Ladona Ridgel

## 2023-11-17 ENCOUNTER — Institutional Professional Consult (permissible substitution): Payer: Self-pay | Admitting: Plastic Surgery

## 2023-12-12 ENCOUNTER — Ambulatory Visit: Payer: Self-pay | Admitting: Nurse Practitioner

## 2023-12-13 NOTE — Progress Notes (Deleted)
   GYNECOLOGY PROBLEM  VISIT ENCOUNTER NOTE  Subjective:   [place] ANAYANSI RUNDQUIST is a 28 y.o. G0P0 female here for a problem GYN visit.  Current complaints: ***.   Denies abnormal vaginal bleeding, discharge, pelvic pain, problems with intercourse or other gynecologic concerns.    Gynecologic History No LMP recorded. (Menstrual status: Irregular Periods).  Contraception: {method:5051}  Health Maintenance Due  Topic Date Due   Pneumococcal Vaccine 71-10 Years old (1 of 2 - PCV) Never done   Hepatitis C Screening  Never done   Cervical Cancer Screening (Pap smear)  Never done   COVID-19 Vaccine (1 - 2024-25 season) Never done    The following portions of the patient's history were reviewed and updated as appropriate: allergies, current medications, past family history, past medical history, past social history, past surgical history and problem list.  Review of Systems {ros; complete:30496}   Objective:  There were no vitals taken for this visit. Gen: well appearing, NAD HEENT: no scleral icterus CV: RR Lung: Normal WOB Ext: warm well perfused  PELVIC: Normal appearing external genitalia; normal appearing vaginal mucosa and cervix.  No abnormal discharge noted.  ***Pap smear obtained.  Normal uterine size, no other palpable masses, no uterine or adnexal tenderness.   Assessment and Plan:  There are no diagnoses linked to this encounter.  Please refer to After Visit Summary for other counseling recommendations.   No follow-ups on file.  Federico Flake, MD, MPH, ABFM Attending Physician Faculty Practice- [facility]

## 2023-12-14 ENCOUNTER — Encounter: Payer: Medicaid Other | Admitting: Family Medicine

## 2024-01-13 ENCOUNTER — Ambulatory Visit (INDEPENDENT_AMBULATORY_CARE_PROVIDER_SITE_OTHER): Payer: Self-pay | Admitting: Nurse Practitioner

## 2024-01-13 ENCOUNTER — Encounter: Payer: Self-pay | Admitting: Nurse Practitioner

## 2024-01-13 VITALS — BP 137/96 | HR 76 | Temp 98.6°F | Ht 68.0 in | Wt 287.0 lb

## 2024-01-13 DIAGNOSIS — E66813 Obesity, class 3: Secondary | ICD-10-CM | POA: Diagnosis not present

## 2024-01-13 DIAGNOSIS — D72829 Elevated white blood cell count, unspecified: Secondary | ICD-10-CM

## 2024-01-13 DIAGNOSIS — Z6841 Body Mass Index (BMI) 40.0 and over, adult: Secondary | ICD-10-CM | POA: Diagnosis not present

## 2024-01-13 NOTE — Progress Notes (Signed)
   Subjective   Patient ID: Sharon Gould, female    DOB: 03/08/1996, 28 y.o.   MRN: [id number]  Chief Complaint  Patient presents with   Follow-up    Follow up regarding blood pressure ,  haven't been checking this at home    Referring provider: Jerrlyn Morel, NP  Sharon Gould is a 28 y.o. female with No past medical history on file.   HPI  Patient presents today for a follow-up visit.  Blood pressure is still borderline in office today.  Patient has been exercising and following a low-salt diet.  We will place a referral for her for weight management.  At last visit patient's white blood cell count was noted to be elevated for the last few checks.  We will recheck this today and place referral to hematology if still elevated. Denies f/c/s, n/v/d, hemoptysis, PND, leg swelling Denies chest pain or edema      Allergies  Allergen Reactions   Other Nausea And Vomiting    Muscle relaxer's make the throw up.      There is no immunization history on file for this patient.  Tobacco History: Social History   Tobacco Use  Smoking Status Former   Current packs/day: 0.25   Types: Cigarettes  Smokeless Tobacco Never   Counseling given: Not Answered   Outpatient Encounter Medications as of 01/13/2024  Medication Sig   oseltamivir  (TAMIFLU ) 75 MG capsule Take 1 capsule (75 mg total) by mouth 2 (two) times daily.   No facility-administered encounter medications on file as of 01/13/2024.    Review of Systems  Review of Systems  Constitutional: Negative.   HENT: Negative.    Cardiovascular: Negative.   Gastrointestinal: Negative.   Allergic/Immunologic: Negative.   Neurological: Negative.   Psychiatric/Behavioral: Negative.       Objective:   BP (!) 137/96 (BP Location: Right Arm, Patient Position: Sitting, Cuff Size: Large)   Pulse 76   Temp 98.6 F (37 C) (Oral)   Ht 5\' 8"  (1.727 m)   Wt 287 lb (130.2 kg)   SpO2 100%   BMI 43.64 kg/m   Wt Readings from  Last 5 Encounters:  01/13/24 287 lb (130.2 kg)  09/16/23 282 lb 6.4 oz (128.1 kg)  05/24/23 220 lb (99.8 kg)  07/25/17 231 lb 8 oz (105 kg)  03/05/16 197 lb (89.4 kg) (97%, Z= 1.90)*   * Growth percentiles are based on CDC (Girls, 2-20 Years) data.     Physical Exam    Assessment & Plan:   Leukocytosis, unspecified type -     CBC  Class 3 severe obesity due to excess calories without serious comorbidity with body mass index (BMI) of 40.0 to 44.9 in adult -     Amb Ref to Medical Weight Management     Return in about 3 months (around 04/14/2024) for BP and cholesterol.   Jerrlyn Morel, NP 01/13/2024

## 2024-01-14 LAB — CBC
Hematocrit: 43.8 % (ref 34.0–46.6)
Hemoglobin: 14.3 g/dL (ref 11.1–15.9)
MCH: 24.9 pg — ABNORMAL LOW (ref 26.6–33.0)
MCHC: 32.6 g/dL (ref 31.5–35.7)
MCV: 76 fL — ABNORMAL LOW (ref 79–97)
Platelets: 312 10*3/uL (ref 150–450)
RBC: 5.74 x10E6/uL — ABNORMAL HIGH (ref 3.77–5.28)
RDW: 13.2 % (ref 11.7–15.4)
WBC: 9.7 10*3/uL (ref 3.4–10.8)

## 2024-01-16 ENCOUNTER — Encounter (INDEPENDENT_AMBULATORY_CARE_PROVIDER_SITE_OTHER): Payer: Self-pay

## 2024-03-21 ENCOUNTER — Institutional Professional Consult (permissible substitution) (INDEPENDENT_AMBULATORY_CARE_PROVIDER_SITE_OTHER): Payer: Self-pay | Admitting: Adult Health

## 2024-03-21 ENCOUNTER — Encounter (INDEPENDENT_AMBULATORY_CARE_PROVIDER_SITE_OTHER): Payer: Self-pay

## 2024-04-16 ENCOUNTER — Ambulatory Visit: Admitting: Nurse Practitioner

## 2024-05-04 ENCOUNTER — Ambulatory Visit: Admitting: Nurse Practitioner

## 2024-05-24 ENCOUNTER — Ambulatory Visit: Admitting: Nurse Practitioner
